# Patient Record
Sex: Female | Born: 1975
Health system: Southern US, Community
[De-identification: ages and names within clinical notes are randomized; demographics above are authoritative.]

## PROBLEM LIST (undated history)

## (undated) DIAGNOSIS — G510 Bell's palsy: Secondary | ICD-10-CM

## (undated) DIAGNOSIS — D219 Benign neoplasm of connective and other soft tissue, unspecified: Secondary | ICD-10-CM

## (undated) DIAGNOSIS — I4891 Unspecified atrial fibrillation: Secondary | ICD-10-CM

## (undated) HISTORY — DX: Benign neoplasm of connective and other soft tissue, unspecified: D21.9

## (undated) HISTORY — PX: ABDOMINAL HYSTERECTOMY: SHX81

## (undated) HISTORY — DX: Bell's palsy: G51.0

---

## 1998-10-07 ENCOUNTER — Other Ambulatory Visit: Admission: RE | Admit: 1998-10-07 | Discharge: 1998-10-07 | Payer: Self-pay | Admitting: Obstetrics and Gynecology

## 1999-05-06 ENCOUNTER — Inpatient Hospital Stay (HOSPITAL_COMMUNITY): Admission: AD | Admit: 1999-05-06 | Discharge: 1999-05-08 | Payer: Self-pay | Admitting: Obstetrics and Gynecology

## 1999-06-25 ENCOUNTER — Other Ambulatory Visit: Admission: RE | Admit: 1999-06-25 | Discharge: 1999-06-25 | Payer: Self-pay | Admitting: Obstetrics and Gynecology

## 2000-06-28 ENCOUNTER — Other Ambulatory Visit: Admission: RE | Admit: 2000-06-28 | Discharge: 2000-06-28 | Payer: Self-pay | Admitting: Obstetrics and Gynecology

## 2000-12-22 ENCOUNTER — Emergency Department (HOSPITAL_COMMUNITY): Admission: EM | Admit: 2000-12-22 | Discharge: 2000-12-22 | Payer: Self-pay | Admitting: Emergency Medicine

## 2000-12-22 ENCOUNTER — Encounter: Payer: Self-pay | Admitting: Emergency Medicine

## 2001-06-29 ENCOUNTER — Other Ambulatory Visit: Admission: RE | Admit: 2001-06-29 | Discharge: 2001-06-29 | Payer: Self-pay | Admitting: Obstetrics and Gynecology

## 2001-07-06 ENCOUNTER — Encounter: Admission: RE | Admit: 2001-07-06 | Discharge: 2001-10-04 | Payer: Self-pay | Admitting: Obstetrics and Gynecology

## 2002-07-19 ENCOUNTER — Other Ambulatory Visit: Admission: RE | Admit: 2002-07-19 | Discharge: 2002-07-19 | Payer: Self-pay | Admitting: Obstetrics and Gynecology

## 2003-07-23 ENCOUNTER — Other Ambulatory Visit: Admission: RE | Admit: 2003-07-23 | Discharge: 2003-07-23 | Payer: Self-pay | Admitting: Obstetrics and Gynecology

## 2004-11-11 ENCOUNTER — Other Ambulatory Visit: Admission: RE | Admit: 2004-11-11 | Discharge: 2004-11-11 | Payer: Self-pay | Admitting: Obstetrics and Gynecology

## 2005-09-15 ENCOUNTER — Encounter (INDEPENDENT_AMBULATORY_CARE_PROVIDER_SITE_OTHER): Payer: Self-pay | Admitting: *Deleted

## 2005-09-16 ENCOUNTER — Inpatient Hospital Stay (HOSPITAL_COMMUNITY): Admission: RE | Admit: 2005-09-16 | Discharge: 2005-09-17 | Payer: Self-pay | Admitting: Obstetrics and Gynecology

## 2005-09-17 ENCOUNTER — Ambulatory Visit: Payer: Self-pay | Admitting: Internal Medicine

## 2005-11-06 ENCOUNTER — Ambulatory Visit (HOSPITAL_COMMUNITY): Admission: RE | Admit: 2005-11-06 | Discharge: 2005-11-06 | Payer: Self-pay | Admitting: Obstetrics and Gynecology

## 2005-12-22 ENCOUNTER — Other Ambulatory Visit: Admission: RE | Admit: 2005-12-22 | Discharge: 2005-12-22 | Payer: Self-pay | Admitting: Obstetrics and Gynecology

## 2007-01-11 ENCOUNTER — Other Ambulatory Visit: Admission: RE | Admit: 2007-01-11 | Discharge: 2007-01-11 | Payer: Self-pay | Admitting: Obstetrics and Gynecology

## 2007-01-14 ENCOUNTER — Ambulatory Visit: Payer: Self-pay | Admitting: Internal Medicine

## 2007-01-14 LAB — CONVERTED CEMR LAB
ALT: 10 units/L (ref 0–35)
AST: 13 units/L (ref 0–37)
Albumin: 3.9 g/dL (ref 3.5–5.2)
Alkaline Phosphatase: 56 units/L (ref 39–117)
Anti Nuclear Antibody(ANA): NEGATIVE
BUN: 12 mg/dL (ref 6–23)
Basophils Absolute: 0.1 10*3/uL (ref 0.0–0.1)
Basophils Relative: 0.6 % (ref 0.0–1.0)
CO2: 31 meq/L (ref 19–32)
Calcium: 9.2 mg/dL (ref 8.4–10.5)
Chloride: 105 meq/L (ref 96–112)
Creatinine, Ser: 0.8 mg/dL (ref 0.4–1.2)
Eosinophils Absolute: 0.5 10*3/uL (ref 0.0–0.6)
Eosinophils Relative: 5.1 % — ABNORMAL HIGH (ref 0.0–5.0)
GFR calc Af Amer: 108 mL/min
GFR calc non Af Amer: 90 mL/min
Glucose, Bld: 90 mg/dL (ref 70–99)
HCT: 38.4 % (ref 36.0–46.0)
Hemoglobin: 13.1 g/dL (ref 12.0–15.0)
IgA: 193 mg/dL (ref 68–378)
IgE (Immunoglobulin E), Serum: 44 intl units/mL (ref 0.0–180.0)
IgG (Immunoglobin G), Serum: 825 mg/dL (ref 694–1618)
Lymphocytes Relative: 35.2 % (ref 12.0–46.0)
MCHC: 34.3 g/dL (ref 30.0–36.0)
MCV: 90.7 fL (ref 78.0–100.0)
Monocytes Absolute: 0.9 10*3/uL — ABNORMAL HIGH (ref 0.2–0.7)
Monocytes Relative: 9.3 % (ref 3.0–11.0)
Neutro Abs: 4.7 10*3/uL (ref 1.4–7.7)
Neutrophils Relative %: 49.8 % (ref 43.0–77.0)
Platelets: 328 10*3/uL (ref 150–400)
Potassium: 4 meq/L (ref 3.5–5.1)
RBC: 4.23 M/uL (ref 3.87–5.11)
RDW: 12.4 % (ref 11.5–14.6)
Sed Rate: 7 mm/hr (ref 0–25)
Sodium: 141 meq/L (ref 135–145)
Total Bilirubin: 0.8 mg/dL (ref 0.3–1.2)
Total Protein: 6.4 g/dL (ref 6.0–8.3)
WBC: 9.5 10*3/uL (ref 4.5–10.5)

## 2007-01-28 ENCOUNTER — Ambulatory Visit: Payer: Self-pay | Admitting: Internal Medicine

## 2007-03-07 ENCOUNTER — Ambulatory Visit: Payer: Self-pay | Admitting: Internal Medicine

## 2007-04-11 ENCOUNTER — Ambulatory Visit: Payer: Self-pay | Admitting: Internal Medicine

## 2008-06-20 ENCOUNTER — Encounter: Payer: Self-pay | Admitting: Gynecology

## 2008-06-20 ENCOUNTER — Ambulatory Visit: Payer: Self-pay | Admitting: Gynecology

## 2008-06-20 ENCOUNTER — Other Ambulatory Visit: Admission: RE | Admit: 2008-06-20 | Discharge: 2008-06-20 | Payer: Self-pay | Admitting: Gynecology

## 2009-06-04 ENCOUNTER — Ambulatory Visit: Payer: Self-pay | Admitting: Gynecology

## 2009-06-23 ENCOUNTER — Emergency Department (HOSPITAL_COMMUNITY): Admission: EM | Admit: 2009-06-23 | Discharge: 2009-06-23 | Payer: Self-pay | Admitting: Emergency Medicine

## 2009-06-25 ENCOUNTER — Ambulatory Visit: Payer: Self-pay | Admitting: Gynecology

## 2009-06-25 ENCOUNTER — Other Ambulatory Visit: Admission: RE | Admit: 2009-06-25 | Discharge: 2009-06-25 | Payer: Self-pay | Admitting: Gynecology

## 2010-07-30 ENCOUNTER — Other Ambulatory Visit
Admission: RE | Admit: 2010-07-30 | Discharge: 2010-07-30 | Payer: Self-pay | Source: Home / Self Care | Admitting: Gynecology

## 2010-07-30 ENCOUNTER — Ambulatory Visit
Admission: RE | Admit: 2010-07-30 | Discharge: 2010-07-30 | Payer: Self-pay | Source: Home / Self Care | Attending: Gynecology | Admitting: Gynecology

## 2010-12-02 NOTE — Assessment & Plan Note (Signed)
[facility]                             PULMONARY OFFICE NOTE   NAME:Anna Evans, Anna Evans                      MRN:          [id number]  DATE:03/07/2007                            DOB:          February 15, 1976    PROBLEM:  Urticaria.   HISTORY:  Still shifting urticaria arms and legs.  Doxepin made her feel  funny so she quit after two weeks recognizing no benefit using it as an  antihistamine at 10 mg daily.  Overall she thinks she is somewhat  better, and that she does not seem to develop lesions every day.  Prednisone did work while she was on it.   MEDICATIONS:  No regular meds now.  Occasional Benadryl, Claritin.   ALLERGIES:  No medication allergy known.   LABORATORY DATA:  Had shown 0.5% eosinophils which is unremarkable with  a sed rate 7.  Normal liver enzymes.  IgA 193, IgG 825.  WBC 9500,  hemoglobin 13.1, ANA negative.  IgE 44 which is not very high.   OBJECTIVE/PHYSICAL EXAMINATION:  VITAL SIGNS:  Weight 181 pounds, BP  102/66, pulse 88, room air saturation 99%.  SKIN:  A little bit of excoriation, no visible urticarial lesions now,  nothing specific, no adenopathy or drainage.  CHEST:  Clear.  HEART:  Heart sounds normal.  I cannot feel liver or spleen.   IMPRESSION:  Subacute urticaria.   PLAN:  Try prednisone 10 mg daily for one month with steroid talk done.  She understands that this very routinely remains an idiopathic self-  limited process even though it may stick with her for years.  We again  reviewed some common triggers and circumstances to watch for.  Schedule  return in one month.     Clinton D. Maple Hudson, MD, Tonny Bollman, FACP  Electronically Signed    CDY/MedQ  DD: 03/21/2007  DT: 03/21/2007  Job #: [id number]   cc:   Donia Guiles, M.D.  Daniel B. Yetta Barre, M.D.

## 2010-12-02 NOTE — Assessment & Plan Note (Signed)
[facility]                             PULMONARY OFFICE NOTE   NAME:Scollard, ZAMERIA VOGL                      MRN:          [id number]  DATE:04/11/2007                            DOB:          [birth year]    PROBLEM LIST:  Urticaria.   HISTORY OF PRESENT ILLNESS:  No hives in 2 to 3 weeks. They were fading  while she was still on prednisone but she has been off of that. Taking  no medicines at all currently and she feels quite well.   OBJECTIVE:  VITAL SIGNS:  Weight 178 pounds. Blood pressure 106/72,  pulse 79, room air saturation 99%.  SKIN:  Looks clear.  HEENT:  No adenopathy.  GENERAL:  She seems comfortable.   IMPRESSION:  Urticaria, resolved. No specific etiology or therapeutic  response identified.   PLAN:  Return p.r.n.     Clinton D. Maple Hudson, MD, Tonny Bollman, FACP  Electronically Signed    CDY/MedQ  DD: 04/16/2007  DT: 04/16/2007  Job #: [id number]   cc:   Donia Guiles, M.D.

## 2010-12-02 NOTE — Assessment & Plan Note (Signed)
[facility]                             PULMONARY OFFICE NOTE   NAME:Alverson, Anna Evans                      MRN:          [id number]  DATE:01/28/2007                            DOB:          [birth year]    PROBLEM:  Urticaria.   HISTORY:  She tried switching away from the [employer] softener  product with no benefit obtained. She feels about the same, itching with  little visible rash. We reviewed available labs, significant for an ANA  negative, an IGE in normal range at 44 (0 to 180), white blood count  9,500 with hemoglobin 13.1, and 5.1% eosinophils is really the only  slightly note worthy finding. Her chemistry profile was completely  normal including liver enzymes. IGA was 123, IGG 825.   OBJECTIVE:  Weight 178 pounds, blood pressure 118/76, pulse 60, room air  saturation 98%.  I did a dry scratch on her volar forearm with normal flare but no wheal,  found no adenopathy.  LUNGS: Clear.  I could not feel enlargement of liver or spleen.   IMPRESSION:  Urticaria with pruritus. Eosinophil count of 5.1% is  suggestive at best.   PLAN:  Try doxepin as an antihistamine, 10 mg, 1 or 2 at bedtime p.r.n.  We discussed this medication. We will consider low dose prednisone.  Schedule return 1 month, earlier p.r.n. She is encouraged to follow up  with her other doctors as well.     Clinton D. Maple Hudson, MD, Tonny Bollman, FACP  Electronically Signed    CDY/MedQ  DD: 02/02/2007  DT: 02/03/2007  Job #: [id number]   cc:   Donia Guiles, M.D.  Daniel B. Yetta Barre, M.D.

## 2010-12-02 NOTE — Assessment & Plan Note (Signed)
[facility]                             PULMONARY OFFICE NOTE   NAME:Dohrmann, BERNARD DONAHOO                      MRN:          [id number]  DATE:01/14/2007                            DOB:          [birth year]    NEW PATIENT   PROBLEMS:  A 35 year old woman previously followed by me at [facility]  Chest Disease and Allergy.  Now self-referred.  Concerned about a  pruritic rash.   HISTORY:  When I had seen her over 10 years ago, it was for allergic  rhinitis, particularly to cats.  She comes now with a new problem of  itching and rash that began only about 3 weeks ago and is new for her.  She saw Dr. Arvilla Market and Dr. Karlyn Agee.  A prednisone course cleared it  up a few days after she finished the prednisone, though she says she can  feel it coming back again, mainly on her arms and legs as before.  It  bothers her most starting in the evening, and then at night after she  lies down.  She quit going to a tanning bed and has been staying  indoors, for what she thinks might be environmental factors.  There has  been no recent viral illness.  She has no history of pertinent systemic  disease, liver or thyroid problems.  She has not noticed any association  with either her home or work environment, cosmetics, foods, or other  exposure.  She has been using Bounce dryer sheets, and I told her people  do occasionally get sensitized to bacterial enzymes in those, most  commonly with urticaria, in my experience.   MEDICATIONS:  Recent prednisone, now finished.  P.r.n. Benadryl.  No  routine meds, including hormones, herbal, or alternative preparations.   No medication allergy.   REVIEW OF SYSTEMS:  She is not pregnant.  She does not notice  significant nasal congestion, itching of eyes or nose, cough, or wheeze,  joint pain, GI or GU changes.  She still has her ovaries and does cycle,  but has not recognized any relationship to this.   PAST HISTORY:  Remote  history of allergic rhinitis, especially sensitive  to cats.  Mild intermittent asthma.  Partial hysterectomy in 2007.  No  intolerance to latex, contrast dye, or aspirin.   SOCIAL HISTORY:  Never smoked.  One alcoholic beverage per week.  She is  married.  Living with a dog in the house (for 4 years now).  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   FAMILY HISTORY:  Mother with allergies.  Grandparents with cancer.   OBJECTIVE:  Weight 183 pounds, BP 96/62, pulse 63, room air saturation  98%.  Healthy, friendly, comfortable-appearing young woman.  There is a very faint, spotty hypopigmentation evident through tan on  the arms and legs without excoriation, although she keeps rubbing her  arms.  I do not see erythema, raised lesions, or adenopathy.  Nose, throat, and chest are clear.  HEART:  Sounds are regular without murmur.  There  is no enlargement of liver or spleen.   IMPRESSION:  Urticaria, acute.   PLAN:  1. She is going to stop using Bounce and other enzyme-based laundry      products, and she is going to wash out her dryer drum.  2. Blood for CBC with diff, chemistry panel, sedimentation rate, IgE      ANA.  3. She has samples of Xyzal she has not tried yet, so I told her to go      ahead, comparing with Benadryl.  4. Schedule return in 3 weeks, earlier p.r.n.  I do not know if she      has followup with her other doctors, but I will encourage that when      I see her next.     Clinton D. Maple Hudson, MD, Tonny Bollman, FACP  Electronically Signed    CDY/MedQ  DD: 01/15/2007  DT: 01/15/2007  Job #: [id number]   cc:   Donia Guiles, M.D.  Daniel B. Yetta Barre, M.D.

## 2010-12-05 NOTE — H&P (Signed)
NAMEPRESLEA, RHODUS               ACCOUNT NO.:  [id number]   MEDICAL RECORD NO.:  [id number]          PATIENT TYPE:  AMB   LOCATION:  SDC                           FACILITY:  [facility]   PHYSICIAN:  James A. Ashley Royalty, M.D.DATE OF BIRTH:  1976/05/16   DATE OF ADMISSION:  09/15/2005  DATE OF DISCHARGE:                                HISTORY & PHYSICAL   HISTORY OF PRESENT ILLNESS:  This is a 35 year old gravida 1, para 1 with a  chief complaint of abnormal uterine bleeding.  The patient had a  sonohystogram on May 07, 2005 for the above complaint which revealed no  intracavitary bleeding.  She was treated with [facility] beginning October  2006.  Unfortunately, it did not result in any control of her abnormal  uterine bleeding.  She ultimately decided she preferred definitive therapy  in the form of hysterectomy.   MEDICATIONS:  None.   PAST MEDICAL HISTORY:   MEDICAL:  1.  Asthma.  2.  Irritable bowel syndrome.   SURGICAL:  1.  Breast biopsy.  2.  Therapeutic abortion.   ALLERGIES:  None.   FAMILY HISTORY:  Noncontributory.   SOCIAL HISTORY:  Noncontributory.   REVIEW OF SYSTEMS:  Noncontributory.   PHYSICAL EXAMINATION:  GENERAL:  A well-developed, well-nourished pleasant  white female in no acute distress.  VITAL SIGNS:  Afebrile.  Vital signs stable.  SKIN:  Warm and dry without lesions.  CHEST:  Lungs are clear.  CARDIAC:  Regular rate and rhythm without murmurs, gallops, or rubs.  ABDOMEN:  Soft and nontender without masses or organomegaly.  MUSCULOSKELETAL:  No CVA tenderness.  PELVIC EXAMINATION:  (See August 25, 2005 note.)  Genitalia within normal  limits.  Vagina and cervix were without gross lesions.  Bimanual examination  reveals the uterus to be approximately 8 x 4 x 4 cm.  No adnexal masses are  palpable.   IMPRESSION:  Abnormal uterine bleeding, as well as coital bleeding,  refractory to medical management.   PLAN:  Total vaginal hysterectomy.   Risks, benefits, complications, and  alternatives were fully discussed with the patient.  The possible need for a  laparoscopically-assisted vaginal hysterectomy or a total abdominal  hysterectomy were discussed and accepted.  The possibility of unilateral  salpingo-oophorectomy or bilateral salpingo-oophorectomy were discussed and  accepted.  In the latter event, the patient understands she would become a  candidate for hormone replacement surgery, and agrees to same.  Questions  invited and answered.      James A. Ashley Royalty, M.D.  Electronically Signed     JAM/MEDQ  D:  09/14/2005  T:  09/14/2005  Job:  [id number]

## 2010-12-05 NOTE — Op Note (Signed)
Anna Evans, Anna Evans               ACCOUNT NO.:  [id number]   MEDICAL RECORD NO.:  [id number]          PATIENT TYPE:  OBV   LOCATION:  9311                          FACILITY:  [facility]   PHYSICIAN:  James A. Ashley Royalty, M.D.DATE OF BIRTH:  03/12/1976   DATE OF PROCEDURE:  09/15/2005  DATE OF DISCHARGE:                                 OPERATIVE REPORT   PREOPERATIVE DIAGNOSIS:  Abnormal uterine bleeding that is refractory to  medical management.   POSTOPERATIVE DIAGNOSIS:  Abnormal uterine bleeding that is refractory to  medical management, pathology pending.   PROCEDURE:  Total vaginal hysterectomy.   SURGEON:  Rudy Jew. Ashley Royalty, M.D.   ASSISTANT:  Gerald Leitz, M.D.   ANESTHESIA:  General.   ESTIMATED BLOOD LOSS:  100 mL.   COMPLICATIONS:  None.   PACKS AND DRAINS:  None.   COUNTS:  Sponge, needle and instrument counts were reported as correct x2.   DESCRIPTION OF PROCEDURE:  The patient was taken to the operating room and  placed in the dorsal supine position.  After general anesthesia was  administered, she was placed in the lithotomy position and prepped and  draped in the usual manner for vaginal surgery.  Preoperative antibiotics  were given.  Foley catheter was placed. The posterior weighted retractor was  placed per vagina.  The anterior lip of cervix was grasped with a Christella Hartigan  tenaculum.  Approximately 20 cc of 4% Xylocaine with epinephrine were  instilled to aid in hemostasis.  Next the cervix was circumscribed with a  knife.  Posterior colpotomy incision was made.  The Steiner-Adard retractor  was placed.  The uterosacral ligaments were clamped, cut and secured with a  #1 chromic catgut.  The pedicles were held.  The cardinal ligaments were  clamped, cut and secured bilaterally with #1 chromic catgut.  The anterior  cul-de-sac was entered uneventfully.  The uterine vessels were clamped, cut  and doubly secured with #1 chromic catgut.  Additional pedicles were taken  on  either side of the uterus from peritoneum to peritoneum closer to the  level of the adnexa.  Pedicles were clamped, cut, and secured with #1  chromic catgut.  At this point, the corpus was delivered posteriorly with a  single-tooth tenaculum.  The ovarian anastomoses, round ligaments and  fallopian tubes were clamped, cut and doubly secured with #1 chromic catgut.  The pedicles were noted be hemostatic.  At this point, the shorter posterior  weighted retractor was placed. The posterior vaginal cuff was closed with a  2-0 Vicryl running locking suture.  Irrigation was accomplished.  The  adnexal pedicles were once again inspected and found to be normal and  hemostatic.  The remainder of the field was hemostatic as well.  A 3-0  Vicryl hemi-pursestring suture was placed approximately 1 cm cephalad from  the vaginal cuff on the posterior aspect of the field to assist in  obliterating the cul-de-sac and avoid future enterocele formation.  Finally  the vagina was closed with interrupted figure-of-eight  sutures of 0 chromic catgut.  The field was noted to be hemostatic.  The  vaginal cuff was closed and the procedure terminated.  At the conclusion of  procedure the urine was clear and copious.   The patient tolerated the procedure extremely well and was returned to the  recovery room in good condition.      James A. Ashley Royalty, M.D.  Electronically Signed     JAM/MEDQ  D:  09/15/2005  T:  09/15/2005  Job:  [id number]

## 2010-12-05 NOTE — Discharge Summary (Signed)
Anna Evans, Anna Evans               ACCOUNT NO.:  [id number]   MEDICAL RECORD NO.:  [id number]          PATIENT TYPE:  INP   LOCATION:  9311                          FACILITY:  [facility]   PHYSICIAN:  James A. Ashley Royalty, M.D.DATE OF BIRTH:  08-17-1975   DATE OF ADMISSION:  09/15/2005  DATE OF DISCHARGE:  09/17/2005                                 DISCHARGE SUMMARY   DISCHARGE DIAGNOSES:  1.  Endometriosis.  2.  Abnormal uterine bleeding - refractory to medical management.  3.  Probable postoperative hematoma - stable.  4.  Anemia - probably secondary to #3 - improved.  5.  Asthma.  6.  Irritable bowel syndrome.   OPERATION:  Total vaginal hysterectomy.   CONSULTATION:  Cliffton Asters, M.D., infectious disease.   DISCHARGE MEDICATIONS:  1.  Percocet 5/325 one p.o. q.3-4h. p.r.n. pain.  2.  Motrin 600 mg q.i.d. p.r.n. pain.  3.  Chromagen one p.o. daily.   HISTORY AND PHYSICAL:  This is a 35 year old gravida 1, para 1, with the  aforementioned issues, admitted for definitive therapy in the form of  hysterectomy.  For the remainder of the history and physical, please see  chart.   HOSPITAL COURSE:  Patient was admitted to [facility].  Admission laboratory studies performed.  On September 15, 2005, she was taken  to the operating room and underwent total vaginal hysterectomy per Fayrene Fearing A.  Ashley Royalty, M.D.  The procedure was uncomplicated.  Postoperatively, the  patient dropped her hemoglobin to a level of 5.8 on postoperative day #1.  Serial hemoglobin were obtained and were noted to be stable in the vicinity  of 5.9.  Though the patient was clinically stable, it was felt in her  benefit to receive a modest blood transfusion.  Risks and benefits of same  discussed with patient.  Three units of packed red blood cells were given.  Shortly after the transfusion as initiated, the patient had a temperature  elevation to the 101 degrees F. range.  Subsequently infectious  disease was  consulted and Dr. Cliffton Asters saw the patient.  She was found to have  atelectasis.  It was also suspected that she probably had a pelvic hematoma  which was stable.  On September 17, 2005,  the patient was doing well clinically with hemoglobin of 9.1.  Cultures were  pending.  Patient was felt to be stable for discharge and was discharged  home the afternoon of September 17, 2005, afebrile and in satisfactory  condition.   DISPOSITION:  Patient is to return to [facility] in  approximately one day for follow-up.      James A. Ashley Royalty, M.D.  Electronically Signed     JAM/MEDQ  D:  10/22/2005  T:  10/23/2005  Job:  [id number]

## 2013-04-25 ENCOUNTER — Encounter: Payer: Self-pay | Admitting: Gynecology

## 2013-04-25 ENCOUNTER — Ambulatory Visit (INDEPENDENT_AMBULATORY_CARE_PROVIDER_SITE_OTHER): Payer: BC Managed Care – PPO | Admitting: Gynecology

## 2013-04-25 VITALS — BP 120/82 | Ht 67.0 in | Wt 207.0 lb

## 2013-04-25 DIAGNOSIS — Z01419 Encounter for gynecological examination (general) (routine) without abnormal findings: Secondary | ICD-10-CM

## 2013-04-25 DIAGNOSIS — Z23 Encounter for immunization: Secondary | ICD-10-CM

## 2013-04-25 DIAGNOSIS — R635 Abnormal weight gain: Secondary | ICD-10-CM

## 2013-04-25 LAB — CBC WITH DIFFERENTIAL/PLATELET
Basophils Absolute: 0 10*3/uL (ref 0.0–0.1)
Basophils Relative: 0 % (ref 0–1)
Eosinophils Absolute: 0.4 10*3/uL (ref 0.0–0.7)
Eosinophils Relative: 5 % (ref 0–5)
Lymphs Abs: 3.4 10*3/uL (ref 0.7–4.0)
MCH: 30.4 pg (ref 26.0–34.0)
MCHC: 33.8 g/dL (ref 30.0–36.0)
Neutrophils Relative %: 52 % (ref 43–77)
Platelets: 320 10*3/uL (ref 150–400)
RBC: 4.25 MIL/uL (ref 3.87–5.11)
RDW: 13.8 % (ref 11.5–15.5)

## 2013-04-25 NOTE — Progress Notes (Signed)
Anna Evans Dec 09, 1975 [id number]   History:    37 y.o.  for annual gyn exam whose only made complaint has been weight gain. Patient had been seen since 2012. Patient with prior history of transvaginal hysterectomy. Patient with no history of abnormal Pap smears in the past. Patient would like to have the flu vaccine today.  Past medical history,surgical history, family history and social history were all reviewed and documented in the EPIC chart.  Gynecologic History No LMP recorded. Patient has had a hysterectomy. Contraception: status post hysterectomy Last Pap: 2012. Results were: normal Last mammogram: no prior study. Results were: no prior study  Obstetric History OB History  Gravida Para Term Preterm AB SAB TAB Ectopic Multiple Living  1 1        1     # Outcome Date GA Lbr Len/2nd Weight Sex Delivery Anes PTL Lv  1 PAR                ROS: A ROS was performed and pertinent positives and negatives are included in the history.  GENERAL: No fevers or chills. HEENT: No change in vision, no earache, sore throat or sinus congestion. NECK: No pain or stiffness. CARDIOVASCULAR: No chest pain or pressure. No palpitations. PULMONARY: No shortness of breath, cough or wheeze. GASTROINTESTINAL: No abdominal pain, nausea, vomiting or diarrhea, melena or bright red blood per rectum. GENITOURINARY: No urinary frequency, urgency, hesitancy or dysuria. MUSCULOSKELETAL: No joint or muscle pain, no back pain, no recent trauma. DERMATOLOGIC: No rash, no itching, no lesions. ENDOCRINE: No polyuria, polydipsia, no heat or cold intolerance. No recent change in weight. HEMATOLOGICAL: No anemia or easy bruising or bleeding. NEUROLOGIC: No headache, seizures, numbness, tingling or weakness. PSYCHIATRIC: No depression, no loss of interest in normal activity or change in sleep pattern.     Exam: chaperone present  BP 120/82  Ht 5\' 7"  (1.702 m)  Wt 207 lb (93.895 kg)  BMI 32.41 kg/m2  Body mass index  is 32.41 kg/(m^2).  General appearance : Well developed well nourished female. No acute distress HEENT: Neck supple, trachea midline, no carotid bruits, no thyroidmegaly Lungs: Clear to auscultation, no rhonchi or wheezes, or rib retractions  Heart: Regular rate and rhythm, no murmurs or gallops Breast:Examined in sitting and supine position were symmetrical in appearance, no palpable masses or tenderness,  no skin retraction, no nipple inversion, no nipple discharge, no skin discoloration, no axillary or supraclavicular lymphadenopathy Abdomen: no palpable masses or tenderness, no rebound or guarding Extremities: no edema or skin discoloration or tenderness  Pelvic:  Bartholin, Urethra, Skene Glands: Within normal limits             Vagina: No gross lesions or discharge  Cervix:absent  Uterus absent Adnexa  Without masses or tenderness  Anus and perineum  normal   Rectovaginal  normal sphincter tone without palpated masses or tenderness             Hemoccult not indicated     Assessment/Plan:  37 y.o. female for annual exam was given literature and information on weight reduction diet and exercise. The following labs were ordered: CBC, screen cholesterol, TSH, comprehensive metabolic panel, and urinalysis. Pap smear not done in accordance to the new guidelines. Patient arrived to do her monthly breast exams. Flu vaccine administered today.  Note: This dictation was prepared with  Dragon/digital dictation along withSmart phrase technology. Any transcriptional errors that result from this process are unintentional.   Ok Edwards MD, 2:54 PM  04/25/2013    

## 2013-04-25 NOTE — Patient Instructions (Addendum)
Tetanus, Diphtheria, Pertussis (Tdap) Vaccine What You Need to Know WHY GET VACCINATED? Tetanus, diphtheria and pertussis can be very serious diseases, even for adolescents and adults. Tdap vaccine can protect us from these diseases. TETANUS (Lockjaw) causes painful muscle tightening and stiffness, usually all over the body.  It can lead to tightening of muscles in the head and neck so you can't open your mouth, swallow, or sometimes even breathe. Tetanus kills about 1 out of 5 people who are infected. DIPHTHERIA can cause a thick coating to form in the back of the throat.  It can lead to breathing problems, paralysis, heart failure, and death. PERTUSSIS (Whooping Cough) causes severe coughing spells, which can cause difficulty breathing, vomiting and disturbed sleep.  It can also lead to weight loss, incontinence, and rib fractures. Up to 2 in 100 adolescents and 5 in 100 adults with pertussis are hospitalized or have complications, which could include pneumonia and death. These diseases are caused by bacteria. Diphtheria and pertussis are spread from person to person through coughing or sneezing. Tetanus enters the body through cuts, scratches, or wounds. Before vaccines, the [place] saw as many as 200,000 cases a year of diphtheria and pertussis, and hundreds of cases of tetanus. Since vaccination began, tetanus and diphtheria have dropped by about 99% and pertussis by about 80%. TDAP VACCINE Tdap vaccine can protect adolescents and adults from tetanus, diphtheria, and pertussis. One dose of Tdap is routinely given at age 11 or 12. People who did not get Tdap at that age should get it as soon as possible. Tdap is especially important for health care professionals and anyone having close contact with a baby younger than 12 months. Pregnant women should get a dose of Tdap during every pregnancy, to protect the newborn from pertussis. Infants are most at risk for severe, life-threatening  complications from pertussis. A similar vaccine, called Td, protects from tetanus and diphtheria, but not pertussis. A Td booster should be given every 10 years. Tdap may be given as one of these boosters if you have not already gotten a dose. Tdap may also be given after a severe cut or burn to prevent tetanus infection. Your doctor can give you more information. Tdap may safely be given at the same time as other vaccines. SOME PEOPLE SHOULD NOT GET THIS VACCINE  If you ever had a life-threatening allergic reaction after a dose of any tetanus, diphtheria, or pertussis containing vaccine, OR if you have a severe allergy to any part of this vaccine, you should not get Tdap. Tell your doctor if you have any severe allergies.  If you had a coma, or long or multiple seizures within 7 days after a childhood dose of DTP or DTaP, you should not get Tdap, unless a cause other than the vaccine was found. You can still get Td.  Talk to your doctor if you:  have epilepsy or another nervous system problem,  had severe pain or swelling after any vaccine containing diphtheria, tetanus or pertussis,  ever had Guillain-Barr Syndrome (GBS),  aren't feeling well on the day the shot is scheduled. RISKS OF A VACCINE REACTION With any medicine, including vaccines, there is a chance of side effects. These are usually mild and go away on their own, but serious reactions are also possible. Brief fainting spells can follow a vaccination, leading to injuries from falling. Sitting or lying down for about 15 minutes can help prevent these. Tell your doctor if you feel dizzy or light-headed, or   have vision changes or ringing in the ears. Mild problems following Tdap (Did not interfere with activities)  Pain where the shot was given (about 3 in 4 adolescents or 2 in 3 adults)  Redness or swelling where the shot was given (about 1 person in 5)  Mild fever of at least 100.44F (up to about 1 in 25 adolescents or 1 in  100 adults)  Headache (about 3 or 4 people in 10)  Tiredness (about 1 person in 3 or 4)  Nausea, vomiting, diarrhea, stomach ache (up to 1 in 4 adolescents or 1 in 10 adults)  Chills, body aches, sore joints, rash, swollen glands (uncommon) Moderate problems following Tdap (Interfered with activities, but did not require medical attention)  Pain where the shot was given (about 1 in 5 adolescents or 1 in 100 adults)  Redness or swelling where the shot was given (up to about 1 in 16 adolescents or 1 in 25 adults)  Fever over 102F (about 1 in 100 adolescents or 1 in 250 adults)  Headache (about 3 in 20 adolescents or 1 in 10 adults)  Nausea, vomiting, diarrhea, stomach ache (up to 1 or 3 people in 100)  Swelling of the entire arm where the shot was given (up to about 3 in 100). Severe problems following Tdap (Unable to perform usual activities, required medical attention)  Swelling, severe pain, bleeding and redness in the arm where the shot was given (rare). A severe allergic reaction could occur after any vaccine (estimated less than 1 in a million doses). WHAT IF THERE IS A SERIOUS REACTION? What should I look for?  Look for anything that concerns you, such as signs of a severe allergic reaction, very high fever, or behavior changes. Signs of a severe allergic reaction can include hives, swelling of the face and throat, difficulty breathing, a fast heartbeat, dizziness, and weakness. These would start a few minutes to a few hours after the vaccination. What should I do?  If you think it is a severe allergic reaction or other emergency that can't wait, call 9-1-1 or get the person to the nearest hospital. Otherwise, call your doctor.  Afterward, the reaction should be reported to the "Vaccine Adverse Event Reporting System" (VAERS). Your doctor might file this report, or you can do it yourself through the VAERS web site at www.vaers.LAgents.no, or by calling 1-647-773-0563. VAERS is  only for reporting reactions. They do not give medical advice.  THE NATIONAL VACCINE INJURY COMPENSATION PROGRAM The National Vaccine Injury Compensation Program (VICP) is a federal program that was created to compensate people who may have been injured by certain vaccines. Persons who believe they may have been injured by a vaccine can learn about the program and about filing a claim by calling 1-970-335-7593 or visiting the VICP website at SpiritualWord.at. HOW CAN I LEARN MORE?  Ask your doctor.  Call your local or state health department.  Contact the Centers for Disease Control and Prevention (CDC):  Call 424-224-7334 or visit CDC's website at PicCapture.uy. CDC Tdap Vaccine VIS (11/26/11) Document Released: 01/05/2012 Document Revised: 03/30/2012 Document Reviewed: 01/05/2012 [employer] Patient Information 2014 [place], [place]. Influenza Vaccine (Flu Vaccine, Inactivated) 2013 2014 What You Need to Know WHY GET VACCINATED?  Influenza ("flu") is a contagious disease that spreads around the [place] every winter, usually between October and May.  Flu is caused by the influenza virus, and can be spread by coughing, sneezing, and close contact.  Anyone can get flu, but the risk  of getting flu is highest among children. Symptoms come on suddenly and may last several days. They can include:  Fever or chills.  Sore throat.  Muscle aches.  Fatigue.  Cough.  Headache.  Runny or stuffy nose. Flu can make some people much sicker than others. These people include young children, people 82 and older, pregnant women, and people with certain health conditions such as heart, lung or kidney disease, or a weakened immune system. Flu vaccine is especially important for these people, and anyone in close contact with them. Flu can also lead to pneumonia, and make existing medical conditions worse. It can cause diarrhea and seizures in children. Each year thousands  of people in the [place] die from flu, and many more are hospitalized. Flu vaccine is the best protection we have from flu and its complications. Flu vaccine also helps prevent spreading flu from person to person. INACTIVATED FLU VACCINE There are 2 types of influenza vaccine:  You are getting an inactivated flu vaccine, which does not contain any live influenza virus. It is given by injection with a needle, and often called the "flu shot."  A different live, attenuated (weakened) influenza vaccine is sprayed into the nostrils. This vaccine is described in a separate Vaccine Information Statement. Flu vaccine is recommended every year. Children 6 months through 51 years of age should get 2 doses the first year they get vaccinated. Flu viruses are always changing. Each year's flu vaccine is made to protect from viruses that are most likely to cause disease that year. While flu vaccine cannot prevent all cases of flu, it is our best defense against the disease. Inactivated flu vaccine protects against 3 or 4 different influenza viruses. It takes about 2 weeks for protection to develop after the vaccination, and protection lasts several months to a year. Some illnesses that are not caused by influenza virus are often mistaken for flu. Flu vaccine will not prevent these illnesses. It can only prevent influenza. A "high-dose" flu vaccine is available for people 12 years of age and older. The person giving you the vaccine can tell you more about it. Some inactivated flu vaccine contains a very small amount of a mercury-based preservative called thimerosal. Studies have shown that thimerosal in vaccines is not harmful, but flu vaccines that do not contain a preservative are available. SOME PEOPLE SHOULD NOT GET THIS VACCINE Tell the person who gives you the vaccine:  If you have any severe (life-threatening) allergies. If you ever had a life-threatening allergic reaction after a dose of flu vaccine,  or have a severe allergy to any part of this vaccine, you may be advised not to get a dose. Most, but not all, types of flu vaccine contain a small amount of egg.  If you ever had Guillain Barr Syndrome (a severe paralyzing illness, also called GBS). Some people with a history of GBS should not get this vaccine. This should be discussed with your doctor.  If you are not feeling well. They might suggest waiting until you feel better. But you should come back. RISKS OF A VACCINE REACTION With a vaccine, like any medicine, there is a chance of side effects. These are usually mild and go away on their own. Serious side effects are also possible, but are very rare. Inactivated flu vaccine does not contain live flu virus, sogetting flu from this vaccine is not possible. Brief fainting spells and related symptoms (such as jerking movements) can happen after any medical procedure, including  vaccination. Sitting or lying down for about 15 minutes after a vaccination can help prevent fainting and injuries caused by falls. Tell your doctor if you feel dizzy or lightheaded, or have vision changes or ringing in the ears. Mild problems following inactivated flu vaccine:  Soreness, redness, or swelling where the shot was given.  Hoarseness; sore, red or itchy eyes; or cough.  Fever.  Aches.  Headache.  Itching.  Fatigue. If these problems occur, they usually begin soon after the shot and last 1 or 2 days. Moderate problems following inactivated flu vaccine:  Young children who get inactivated flu vaccine and pneumococcal vaccine (PCV13) at the same time may be at increased risk for seizures caused by fever. Ask your doctor for more information. Tell your doctor if a child who is getting flu vaccine has ever had a seizure. Severe problems following inactivated flu vaccine:  A severe allergic reaction could occur after any vaccine (estimated less than 1 in a million doses).  There is a small  possibility that inactivated flu vaccine could be associated with Guillan Barr Syndrome (GBS), no more than 1 or 2 cases per million people vaccinated. This is much lower than the risk of severe complications from flu, which can be prevented by flu vaccine. The safety of vaccines is always being monitored. For more information, visit: http://floyd.org/ WHAT IF THERE IS A SERIOUS REACTION? What should I look for?  Look for anything that concerns you, such as signs of a severe allergic reaction, very high fever, or behavior changes. Signs of a severe allergic reaction can include hives, swelling of the face and throat, difficulty breathing, a fast heartbeat, dizziness, and weakness. These would start a few minutes to a few hours after the vaccination. What should I do?  If you think it is a severe allergic reaction or other emergency that cannot wait, call [phone] 1  or get the person to the nearest hospital. Otherwise, call your doctor.  Afterward, the reaction should be reported to the Vaccine Adverse Event Reporting System (VAERS). Your doctor might file this report, or you can do it yourself through the VAERS website at www.vaers.LAgents.no, or by calling 1-601-757-1446. VAERS is only for reporting reactions. They do not give medical advice. THE NATIONAL VACCINE INJURY COMPENSATION PROGRAM The National Vaccine Injury Compensation Program (VICP) is a federal program that was created to compensate people who may have been injured by certain vaccines. Persons who believe they may have been injured by a vaccine can learn about the program and about filing a claim by calling 1-(575) 715-5717 or visiting the VICP website at SpiritualWord.at HOW CAN I LEARN MORE?  Ask your doctor.  Call your local or state health department.  Contact the Centers for Disease Control and Prevention (CDC):  Call 904-125-9442 (1-800-CDC-INFO) or  Visit CDC's website at BiotechRoom.com.cy CDC  Inactivated Influenza Vaccine Interim VIS (02/12/12) Document Released: 04/30/2006 Document Revised: 03/30/2012 Document Reviewed: 02/12/2012 [facility] Patient Information 2014 [place], [place].

## 2013-04-26 LAB — URINALYSIS W MICROSCOPIC + REFLEX CULTURE
Bacteria, UA: NONE SEEN
Bilirubin Urine: NEGATIVE
Casts: NONE SEEN
Crystals: NONE SEEN
Ketones, ur: NEGATIVE mg/dL
Nitrite: NEGATIVE
Specific Gravity, Urine: 1.024 (ref 1.005–1.030)
Urobilinogen, UA: 0.2 mg/dL (ref 0.0–1.0)
pH: 5.5 (ref 5.0–8.0)

## 2013-04-26 LAB — COMPREHENSIVE METABOLIC PANEL
ALT: 10 U/L (ref 0–35)
Alkaline Phosphatase: 55 U/L (ref 39–117)
Creat: 0.69 mg/dL (ref 0.50–1.10)
Sodium: 139 mEq/L (ref 135–145)
Total Bilirubin: 0.3 mg/dL (ref 0.3–1.2)
Total Protein: 6.5 g/dL (ref 6.0–8.3)

## 2014-05-21 ENCOUNTER — Encounter: Payer: Self-pay | Admitting: Gynecology

## 2014-09-20 ENCOUNTER — Ambulatory Visit (INDEPENDENT_AMBULATORY_CARE_PROVIDER_SITE_OTHER): Payer: BLUE CROSS/BLUE SHIELD | Admitting: Gynecology

## 2014-09-20 ENCOUNTER — Encounter: Payer: Self-pay | Admitting: Gynecology

## 2014-09-20 VITALS — BP 118/70 | Ht 67.0 in | Wt 217.0 lb

## 2014-09-20 DIAGNOSIS — Z01419 Encounter for gynecological examination (general) (routine) without abnormal findings: Secondary | ICD-10-CM

## 2014-09-20 DIAGNOSIS — R635 Abnormal weight gain: Secondary | ICD-10-CM

## 2014-09-20 LAB — CBC WITH DIFFERENTIAL/PLATELET
Basophils Absolute: 0.1 10*3/uL (ref 0.0–0.1)
Basophils Relative: 1 % (ref 0–1)
EOS ABS: 0.2 10*3/uL (ref 0.0–0.7)
EOS PCT: 3 % (ref 0–5)
HCT: 39.6 % (ref 36.0–46.0)
Hemoglobin: 13 g/dL (ref 12.0–15.0)
LYMPHS ABS: 2.9 10*3/uL (ref 0.7–4.0)
Lymphocytes Relative: 36 % (ref 12–46)
MCH: 29.5 pg (ref 26.0–34.0)
MCHC: 32.8 g/dL (ref 30.0–36.0)
MCV: 90 fL (ref 78.0–100.0)
MPV: 9.2 fL (ref 8.6–12.4)
Monocytes Absolute: 0.6 10*3/uL (ref 0.1–1.0)
Monocytes Relative: 7 % (ref 3–12)
Neutro Abs: 4.3 10*3/uL (ref 1.7–7.7)
Neutrophils Relative %: 53 % (ref 43–77)
PLATELETS: 363 10*3/uL (ref 150–400)
RBC: 4.4 MIL/uL (ref 3.87–5.11)
RDW: 13.7 % (ref 11.5–15.5)
WBC: 8.1 10*3/uL (ref 4.0–10.5)

## 2014-09-20 LAB — LIPID PANEL
CHOL/HDL RATIO: 4 ratio
Cholesterol: 190 mg/dL (ref 0–200)
HDL: 48 mg/dL (ref >=46)
LDL CALC: 122 mg/dL — AB (ref 0–99)
TRIGLYCERIDES: 102 mg/dL (ref <150)
VLDL: 20 mg/dL (ref 0–40)

## 2014-09-20 LAB — COMPREHENSIVE METABOLIC PANEL
ALK PHOS: 50 U/L (ref 39–117)
ALT: 11 U/L (ref 0–35)
AST: 13 U/L (ref 0–37)
Albumin: 4.4 g/dL (ref 3.5–5.2)
BILIRUBIN TOTAL: 0.5 mg/dL (ref 0.2–1.2)
BUN: 12 mg/dL (ref 6–23)
CO2: 28 meq/L (ref 19–32)
CREATININE: 0.66 mg/dL (ref 0.50–1.10)
Calcium: 9.4 mg/dL (ref 8.4–10.5)
Chloride: 102 mEq/L (ref 96–112)
GLUCOSE: 90 mg/dL (ref 70–99)
Potassium: 4.2 mEq/L (ref 3.5–5.3)
SODIUM: 137 meq/L (ref 135–145)
Total Protein: 6.9 g/dL (ref 6.0–8.3)

## 2014-09-20 LAB — TSH: TSH: 1.878 u[IU]/mL (ref 0.350–4.500)

## 2014-09-20 NOTE — Patient Instructions (Signed)
Patient information: High cholesterol (The Basics)  What is cholesterol? - Cholesterol is a substance that is found in the blood. Everyone has some. It is needed for good health. The problem is, people sometimes have too much cholesterol. Compared with people with normal cholesterol, people with high cholesterol have a higher risk of heart attacks, strokes, and other health problems. The higher your cholesterol, the higher your risk of these problems. Cholesterol levels in your body are determined significantly by your diet. Cholesterol levels may also be related to heart disease. The following material helps to explain this relationship and discusses what you can do to help keep your heart healthy. Not all cholesterol is bad. Low-density lipoprotein (LDL) cholesterol is the "bad" cholesterol. It may cause fatty deposits to build up inside your arteries. High-density lipoprotein (HDL) cholesterol is "good." It helps to remove the "bad" LDL cholesterol from your blood. Cholesterol is a very important risk factor for heart disease. Other risk factors are high blood pressure, smoking, stress, heredity, and weight.  The heart muscle gets its supply of blood through the coronary arteries. If your LDL cholesterol is high and your HDL cholesterol is low, you are at risk for having fatty deposits build up in your coronary arteries. This leaves less room through which blood can flow. Without sufficient blood and oxygen, the heart muscle cannot function properly and you may feel chest pains (angina pectoris). When a coronary artery closes up entirely, a part of the heart muscle may die, causing a heart attack (myocardial infarction).  CHECKING CHOLESTEROL When your caregiver sends your blood to a lab to be analyzed for cholesterol, a complete lipid (fat) profile may be done. With this test, the total amount of cholesterol and levels of LDL and HDL are determined. Triglycerides  are a type of fat that circulates in the blood and can also be used to determine heart disease risk. Are there different types of cholesterol? - Yes, there are a few different types. If you get a cholesterol test, you may hear your doctor or nurse talk about: Total cholesterol  LDL cholesterol - Some people call this the "bad" cholesterol. That's because having high LDL levels raises your risk of heart attacks, strokes, and other health problems.  HDL cholesterol - Some people call this the "good" cholesterol. That's because having high HDL levels lowers your risk of heart attacks, strokes, and other health problems.  Non-HDL cholesterol - Non-HDL cholesterol is your total cholesterol minus your HDL cholesterol.  Triglycerides - Triglycerides are not cholesterol. They are a type of fat. But they often get measured when cholesterol is measured. (Having high triglycerides also seems to increase the risk of heart attacks and strokes.)   Keep in mind, though, that many people who cannot meet these goals still have a low risk of heart attacks and strokes. What should I do if my doctor tells me I have high cholesterol? - Ask your doctor what your overall risk of heart attacks and strokes is. High cholesterol, by itself, is not always a reason to worry. Having high cholesterol is just one of many things that can increase your risk of heart attacks and strokes. Other factors that increase your risk include:  Cigarette smoking  High blood pressure  Having a parent, sister, or brother who got heart disease at a young age (Young, in this case, means younger than 55 for men and younger than 65 for women.)  Being a man (Women are at risk, too, but men   have a higher risk.)  Older age  If you are at high risk of heart attacks and strokes, having high cholesterol is a problem. On the other hand, if you have are at low risk, having high cholesterol may not mean much. Should I take medicine to lower cholesterol? - Not  everyone who has high cholesterol needs medicines. Your doctor or nurse will decide if you need them based on your age, family history, and other health concerns.  You should probably take a cholesterol-lowering medicine called a statin if you: Already had a heart attack or stroke  Have known heart disease  Have diabetes  Have a condition called peripheral artery disease, which makes it painful to walk, and happens when the arteries in your legs get clogged with fatty deposits  Have an abdominal aortic aneurysm, which is a widening of the main artery in the belly  Most people with any of the conditions listed above should take a statin no matter what their cholesterol level is. If your doctor or nurse puts you on a statin, stay on it. The medicine may not make you feel any different. But it can help prevent heart attacks, strokes, and death.  Can I lower my cholesterol without medicines? - Yes, you can lower your cholesterol some by:  Avoiding red meat, butter, fried foods, cheese, and other foods that have a lot of saturated fat  Losing weight (if you are overweight)  Being more active Even if these steps do little to change your cholesterol, they can improve your health in many ways.                                                   Cholesterol Control Diet  CONTROLLING CHOLESTEROL WITH DIET Although exercise and lifestyle factors are important, your diet is key. That is because certain foods are known to raise cholesterol and others to lower it. The goal is to balance foods for their effect on cholesterol and more importantly, to replace saturated and trans fat with other types of fat, such as monounsaturated fat, polyunsaturated fat, and omega-3 fatty acids. On average, a person should consume no more than 15 to 17 g of saturated fat daily. Saturated and trans fats are considered "bad" fats, and they will raise LDL cholesterol. Saturated fats are primarily found in animal products such as  meats, butter, and cream. However, that does not mean you need to sacrifice all your favorite foods. Today, there are good tasting, low-fat, low-cholesterol substitutes for most of the things you like to eat. Choose low-fat or nonfat alternatives. Choose round or loin cuts of red meat, since these types of cuts are lowest in fat and cholesterol. Chicken (without the skin), fish, veal, and ground turkey breast are excellent choices. Eliminate fatty meats, such as hot dogs and salami. Even shellfish have little or no saturated fat. Have a 3 oz (85 g) portion when you eat lean meat, poultry, or fish. Trans fats are also called "partially hydrogenated oils." They are oils that have been scientifically manipulated so that they are solid at room temperature resulting in a longer shelf life and improved taste and texture of foods in which they are added. Trans fats are found in stick margarine, some tub margarines, cookies, crackers, and baked goods.  When baking and cooking, oils are an excellent substitute   for butter. The monounsaturated oils are especially beneficial since it is believed they lower LDL and raise HDL. The oils you should avoid entirely are saturated tropical oils, such as coconut and palm.  Remember to eat liberally from food groups that are naturally free of saturated and trans fat, including fish, fruit, vegetables, beans, grains (barley, rice, couscous, bulgur wheat), and pasta (without cream sauces).  IDENTIFYING FOODS THAT LOWER CHOLESTEROL  Soluble fiber may lower your cholesterol. This type of fiber is found in fruits such as apples, vegetables such as broccoli, potatoes, and carrots, legumes such as beans, peas, and lentils, and grains such as barley. Foods fortified with plant sterols (phytosterol) may also lower cholesterol. You should eat at least 2 g per day of these foods for a cholesterol lowering effect.  Read package labels to identify low-saturated fats, trans fats free, and  low-fat foods at the supermarket. Select cheeses that have only 2 to 3 g saturated fat per ounce. Use a heart-healthy tub margarine that is free of trans fats or partially hydrogenated oil. When buying baked goods (cookies, crackers), avoid partially hydrogenated oils. Breads and muffins should be made from whole grains (whole-wheat or whole oat flour, instead of "flour" or "enriched flour"). Buy non-creamy canned soups with reduced salt and no added fats.  FOOD PREPARATION TECHNIQUES  Never deep-fry. If you must fry, either stir-fry, which uses very little fat, or use non-stick cooking sprays. When possible, broil, bake, or roast meats, and steam vegetables. Instead of dressing vegetables with butter or margarine, use lemon and herbs, applesauce and cinnamon (for squash and sweet potatoes), nonfat yogurt, salsa, and low-fat dressings for salads.  LOW-SATURATED FAT / LOW-FAT FOOD SUBSTITUTES Meats / Saturated Fat (g)  Avoid: Steak, marbled (3 oz/85 g) / 11 g   Choose: Steak, lean (3 oz/85 g) / 4 g   Avoid: Hamburger (3 oz/85 g) / 7 g   Choose: Hamburger, lean (3 oz/85 g) / 5 g   Avoid: Ham (3 oz/85 g) / 6 g   Choose: Ham, lean cut (3 oz/85 g) / 2.4 g   Avoid: Chicken, with skin, dark meat (3 oz/85 g) / 4 g   Choose: Chicken, skin removed, dark meat (3 oz/85 g) / 2 g   Avoid: Chicken, with skin, light meat (3 oz/85 g) / 2.5 g   Choose: Chicken, skin removed, light meat (3 oz/85 g) / 1 g  Dairy / Saturated Fat (g)  Avoid: Whole milk (1 cup) / 5 g   Choose: Low-fat milk, 2% (1 cup) / 3 g   Choose: Low-fat milk, 1% (1 cup) / 1.5 g   Choose: Skim milk (1 cup) / 0.3 g   Avoid: Hard cheese (1 oz/28 g) / 6 g   Choose: Skim milk cheese (1 oz/28 g) / 2 to 3 g   Avoid: Cottage cheese, 4% fat (1 cup) / 6.5 g   Choose: Low-fat cottage cheese, 1% fat (1 cup) / 1.5 g   Avoid: Ice cream (1 cup) / 9 g   Choose: Sherbet (1 cup) / 2.5 g   Choose: Nonfat frozen yogurt (1 cup) / 0.3 g    Choose: Frozen fruit bar / trace   Avoid: Whipped cream (1 tbs) / 3.5 g   Choose: Nondairy whipped topping (1 tbs) / 1 g  Condiments / Saturated Fat (g)  Avoid: Mayonnaise (1 tbs) / 2 g   Choose: Low-fat mayonnaise (1 tbs) / 1 g   Avoid:   Butter (1 tbs) / 7 g   Choose: Extra light margarine (1 tbs) / 1 g   Avoid: Coconut oil (1 tbs) / 11.8 g   Choose: Olive oil (1 tbs) / 1.8 g   Choose: Corn oil (1 tbs) / 1.7 g   Choose: Safflower oil (1 tbs) / 1.2 g   Choose: Sunflower oil (1 tbs) / 1.4 g   Choose: Soybean oil (1 tbs) / 2.4 g   Choose: Canola oil (1 tbs) / 1 g  Exercise to Lose Weight Exercise and a healthy diet may help you lose weight. Your doctor may suggest specific exercises. EXERCISE IDEAS AND TIPS  Choose low-cost things you enjoy doing, such as walking, bicycling, or exercising to workout videos.   Take stairs instead of the elevator.   Walk during your lunch break.   Park your car further away from work or school.   Go to a gym or an exercise class.   Start with 5 to 10 minutes of exercise each day. Build up to 30 minutes of exercise 4 to 6 days a week.   Wear shoes with good support and comfortable clothes.   Stretch before and after working out.   Work out until you breathe harder and your heart beats faster.   Drink extra water when you exercise.   Do not do so much that you hurt yourself, feel dizzy, or get very short of breath.  Exercises that burn about 150 calories:  Running 1  miles in 15 minutes.   Playing volleyball for 45 to 60 minutes.   Washing and waxing a car for 45 to 60 minutes.   Playing touch football for 45 minutes.   Walking 1  miles in 35 minutes.   Pushing a stroller 1  miles in 30 minutes.   Playing basketball for 30 minutes.   Raking leaves for 30 minutes.   Bicycling 5 miles in 30 minutes.   Walking 2 miles in 30 minutes.   Dancing for 30 minutes.   Shoveling snow for 15 minutes.   Swimming laps  for 20 minutes.   Walking up stairs for 15 minutes.   Bicycling 4 miles in 15 minutes.   Gardening for 30 to 45 minutes.   Jumping rope for 15 minutes.   Washing windows or floors for 45 to 60 minutes.  Document Released: 08/08/2010 Document Revised: 03/18/2011 Document Reviewed: 08/08/2010 [employer] Patient Information 2012 [employer].                                           

## 2014-09-20 NOTE — Progress Notes (Signed)
Anna Evans 10-05-1975 [id number]   History:    39 y.o.  for annual gyn exam with no complaints today. Patient is fasting and would like to have her blood work drawn today since she has not seen her PCP in over a year. Patient with prior history of transvaginal hysterectomy with ovarian conservation. Patient prior to that has never had any history of any abnormal Pap smears. Later today she scheduled to see the orthopedic surgeon because of painful knees.  Past medical history,surgical history, family history and social history were all reviewed and documented in the EPIC chart.  Gynecologic History No LMP recorded. Patient has had a hysterectomy. Contraception: status post hysterectomy Last Pap: 2012. Results were: normal Last mammogram: Not indicated. Results were: Not indicated  Obstetric History OB History  Gravida Para Term Preterm AB SAB TAB Ectopic Multiple Living  1 1        1     # Outcome Date GA Lbr Len/2nd Weight Sex Delivery Anes PTL Lv  1 Para                ROS: A ROS was performed and pertinent positives and negatives are included in the history.  GENERAL: No fevers or chills. HEENT: No change in vision, no earache, sore throat or sinus congestion. NECK: No pain or stiffness. CARDIOVASCULAR: No chest pain or pressure. No palpitations. PULMONARY: No shortness of breath, cough or wheeze. GASTROINTESTINAL: No abdominal pain, nausea, vomiting or diarrhea, melena or bright red blood per rectum. GENITOURINARY: No urinary frequency, urgency, hesitancy or dysuria. MUSCULOSKELETAL: No joint or muscle pain, no back pain, no recent trauma. DERMATOLOGIC: No rash, no itching, no lesions. ENDOCRINE: No polyuria, polydipsia, no heat or cold intolerance. No recent change in weight. HEMATOLOGICAL: No anemia or easy bruising or bleeding. NEUROLOGIC: No headache, seizures, numbness, tingling or weakness. PSYCHIATRIC: No depression, no loss of interest in normal activity or change in  sleep pattern.     Exam: chaperone present  BP 118/70 mmHg  Ht 5\' 7"  (1.702 m)  Wt 217 lb (98.431 kg)  BMI 33.98 kg/m2  Body mass index is 33.98 kg/(m^2).  General appearance : Well developed well nourished female. No acute distress HEENT: Eyes: no retinal hemorrhage or exudates,  Neck supple, trachea midline, no carotid bruits, no thyroidmegaly Lungs: Clear to auscultation, no rhonchi or wheezes, or rib retractions  Heart: Regular rate and rhythm, no murmurs or gallops Breast:Examined in sitting and supine position were symmetrical in appearance, no palpable masses or tenderness,  no skin retraction, no nipple inversion, no nipple discharge, no skin discoloration, no axillary or supraclavicular lymphadenopathy Abdomen: no palpable masses or tenderness, no rebound or guarding Extremities: no edema or skin discoloration or tenderness  Pelvic:  Bartholin, Urethra, Skene Glands: Within normal limits             Vagina: No gross lesions or discharge  Cervix: Absent  Uterus  absent  Adnexa  Without masses or tenderness  Anus and perineum  normal   Rectovaginal  normal sphincter tone without palpated masses or tenderness             Hemoccult not indicated     Assessment/Plan:  39 y.o. female for annual exam who was counseled on her weight. She is limited because of her painful knees for which she is going to see the orthopedic surgeon today. I provided her with a handout on low-fat diet and regular exercise program. Patient would no longer need  Pap smears according to the new guidelines. The following screening labs were ordered today: CBC, comprehensive metabolic panel, TSH, cholesterol and urinalysis. Patient was reminded to do her monthly breast exams.   Terrance Mass MD, 11:35 AM 09/20/2014

## 2014-09-21 LAB — URINALYSIS W MICROSCOPIC + REFLEX CULTURE
Bacteria, UA: NONE SEEN
Bilirubin Urine: NEGATIVE
CASTS: NONE SEEN
Crystals: NONE SEEN
GLUCOSE, UA: NEGATIVE mg/dL
HGB URINE DIPSTICK: NEGATIVE
KETONES UR: NEGATIVE mg/dL
Leukocytes, UA: NEGATIVE
NITRITE: NEGATIVE
Protein, ur: NEGATIVE mg/dL
Specific Gravity, Urine: 1.018 (ref 1.005–1.030)
Squamous Epithelial / LPF: NONE SEEN
Urobilinogen, UA: 0.2 mg/dL (ref 0.0–1.0)
pH: 5 (ref 5.0–8.0)

## 2014-09-30 ENCOUNTER — Encounter (HOSPITAL_COMMUNITY): Payer: Self-pay | Admitting: *Deleted

## 2014-09-30 ENCOUNTER — Emergency Department (HOSPITAL_COMMUNITY)
Admission: EM | Admit: 2014-09-30 | Discharge: 2014-09-30 | Disposition: A | Discharge status: Home / Self Care | Payer: BLUE CROSS/BLUE SHIELD | Attending: Emergency Medicine | Admitting: Emergency Medicine

## 2014-09-30 ENCOUNTER — Emergency Department (HOSPITAL_COMMUNITY): Payer: BLUE CROSS/BLUE SHIELD

## 2014-09-30 DIAGNOSIS — Z23 Encounter for immunization: Secondary | ICD-10-CM | POA: Diagnosis not present

## 2014-09-30 DIAGNOSIS — S81812A Laceration without foreign body, left lower leg, initial encounter: Secondary | ICD-10-CM | POA: Diagnosis present

## 2014-09-30 DIAGNOSIS — Z8742 Personal history of other diseases of the female genital tract: Secondary | ICD-10-CM | POA: Insufficient documentation

## 2014-09-30 DIAGNOSIS — W228XXA Striking against or struck by other objects, initial encounter: Secondary | ICD-10-CM | POA: Diagnosis not present

## 2014-09-30 DIAGNOSIS — Y998 Other external cause status: Secondary | ICD-10-CM | POA: Insufficient documentation

## 2014-09-30 DIAGNOSIS — Z8669 Personal history of other diseases of the nervous system and sense organs: Secondary | ICD-10-CM | POA: Diagnosis not present

## 2014-09-30 DIAGNOSIS — IMO0002 Reserved for concepts with insufficient information to code with codable children: Secondary | ICD-10-CM

## 2014-09-30 DIAGNOSIS — Y9389 Activity, other specified: Secondary | ICD-10-CM | POA: Diagnosis not present

## 2014-09-30 DIAGNOSIS — Y929 Unspecified place or not applicable: Secondary | ICD-10-CM | POA: Diagnosis not present

## 2014-09-30 MED ORDER — LIDOCAINE HCL (PF) 1 % IJ SOLN
5.0000 mL | Freq: Once | INTRAMUSCULAR | Status: AC
  Administered 2014-09-30: 5 mL via INTRADERMAL
  Filled 2014-09-30: qty 5

## 2014-09-30 MED ORDER — TETANUS-DIPHTH-ACELL PERTUSSIS 5-2.5-18.5 LF-MCG/0.5 IM SUSP
0.5000 mL | Freq: Once | INTRAMUSCULAR | Status: AC
  Administered 2014-09-30: 0.5 mL via INTRAMUSCULAR
  Filled 2014-09-30: qty 0.5

## 2014-09-30 MED ORDER — HYDROCODONE-ACETAMINOPHEN 5-325 MG PO TABS
1.0000 | ORAL_TABLET | Freq: Once | ORAL | Status: AC
  Administered 2014-09-30: 1 via ORAL
  Filled 2014-09-30: qty 1

## 2014-09-30 NOTE — ED Notes (Signed)
Pt returned from X Ray.

## 2014-09-30 NOTE — ED Notes (Signed)
She struck her tibia with a table corner

## 2014-09-30 NOTE — Discharge Instructions (Signed)
Please follow the directions provided.  Be sure to follow-up in 7 days for wound re-check and suture removal.  Keep the wound clean and dry and change your dressing daily.  Don't hesitate to return for any new, worsening or concerning symptoms.    SEEK IMMEDIATE MEDICAL CARE IF:  Your pain is not controlled with prescribed medicine.  You have severe swelling around the wound causing pain and numbness or a change in color in your arm, hand, leg, or foot.  Your wound splits open and starts bleeding.  You have worsening numbness, weakness, or loss of function of any joint around or beyond the wound.  You develop painful lumps near the wound or on the skin anywhere on your body.

## 2014-09-30 NOTE — ED Notes (Signed)
The pt is c/o a small laceration to the lt tibia just below the lt knee

## 2014-09-30 NOTE — ED Provider Notes (Signed)
CSN: [id number]     Arrival date & time 09/30/14  2105 History   First MD Initiated Contact with Patient 09/30/14 2223     Chief Complaint  Patient presents with  . Laceration   (Consider location/radiation/quality/duration/timing/severity/associated sxs/prior Treatment) HPI Anna Evans is a 39 yo female presenting with report of laceration to left leg.  She states about an hour PTA she was moving a end table and it fell.  The corner of her table hit her in the front of her left lower leg.  She was able to control bleeding after holding pressure for a while.  She rates her pain as 4/10. She reports some tenderness around the wound but no difficulty walking or alteration in sensation.    Past Medical History  Diagnosis Date  . Bell's palsy   . Fibroid    Past Surgical History  Procedure Laterality Date  . Abdominal hysterectomy      TVH   Family History  Problem Relation Age of Onset  . Heart disease Father   . Heart disease Paternal Grandmother    History  Substance Use Topics  . Smoking status: Never Smoker   . Smokeless tobacco: Not on file  . Alcohol Use: 0.0 oz/week    0 Standard drinks or equivalent per week     Comment: social   OB History    Gravida Para Term Preterm AB TAB SAB Ectopic Multiple Living   1 1        1      Review of Systems  Musculoskeletal: Positive for myalgias.  Skin: Positive for wound.  Neurological: Negative for weakness.      Allergies  Review of patient's allergies indicates no known allergies.  Home Medications   Prior to Admission medications   Not on File   BP 135/68 mmHg  Pulse 94  Temp(Src) 98.4 F (36.9 C)  Resp 16  Ht 5\' 7"  (1.702 m)  Wt 217 lb (98.431 kg)  BMI 33.98 kg/m2  SpO2 97% Physical Exam  Constitutional: She appears well-developed and well-nourished. No distress.  HENT:  Head: Normocephalic and atraumatic.  Eyes: Conjunctivae are normal.  Neck: Neck supple.  Cardiovascular: Normal rate, regular  rhythm and intact distal pulses.   Pulmonary/Chest: Effort normal and breath sounds normal.  Musculoskeletal: She exhibits tenderness.  Neurological: She is alert.  Skin: Skin is warm and dry. She is not diaphoretic.     2 cm lac to anterior upper third lower left leg  Psychiatric: She has a normal mood and affect.  Nursing note and vitals reviewed.   ED Course  Procedures (including critical care time) LACERATION REPAIR Performed by: Britt Bottom Authorized by: Britt Bottom Consent: Verbal consent obtained. Risks and benefits: risks, benefits and alternatives were discussed Consent given by: patient Patient identity confirmed: provided demographic data Prepped and Draped in normal sterile fashion Wound explored  Laceration Location: anterior left leg  Laceration Length: 2 cm  No Foreign Bodies seen or palpated  Anesthesia: local infiltration  Local anesthetic: lidocaine 1% without epinephrine  Anesthetic total: 3 ml  Irrigation method: syringe Amount of cleaning: standard  Skin closure: 4-0 prolene  Number of sutures: 2  Technique: simple interrupted  Patient tolerance: Patient tolerated the procedure well with no immediate complications.   Labs Review Labs Reviewed - No data to display  Imaging Review No results found.   EKG Interpretation None      MDM   Final diagnoses:  Laceration   38  yo with laceration, x-ray negative for fracture. The laceration occurred < 2 hours prior to repair.  Tdap booster given. Pressure irrigation performed and repair was well tolerated.  Discussed suture home care w pt and answered questions. Pt is well-appearing, in no acute distress and vital signs reviewed and not concerning. She appears safe to be discharged.  Discharge include return in 7 days for suture removal. Return precautions provided and pt aware of plan.      Filed Vitals:   09/30/14 2114 09/30/14 2343  BP: 135/68 101/55  Pulse: 94 82    Temp: 98.4 F (36.9 C)   Resp: 16 14  Height: 5\' 7"  (1.702 m)   Weight: 217 lb (98.431 kg)   SpO2: 97% 97%   Meds given in ED:  Medications  HYDROcodone-acetaminophen (NORCO/VICODIN) 5-325 MG per tablet 1 tablet (1 tablet Oral Given 09/30/14 2235)  Tdap (BOOSTRIX) injection 0.5 mL (0.5 mLs Intramuscular Given 09/30/14 2235)  lidocaine (PF) (XYLOCAINE) 1 % injection 5 mL (5 mLs Intradermal Given by Other 09/30/14 2258)    There are no discharge medications for this patient.      Britt Bottom, NP 10/02/14 Dwight Mission, MD 10/04/14 [phone]

## 2014-09-30 NOTE — ED Notes (Signed)
Patient transported to X-ray 

## 2014-09-30 NOTE — ED Notes (Signed)
Suture cart and wound care at bedside.

## 2014-09-30 NOTE — ED Notes (Signed)
Suture cart at bedside 

## 2016-08-24 ENCOUNTER — Encounter: Payer: Self-pay | Admitting: Gynecology

## 2016-08-24 ENCOUNTER — Ambulatory Visit (INDEPENDENT_AMBULATORY_CARE_PROVIDER_SITE_OTHER): Payer: 59

## 2016-08-24 ENCOUNTER — Ambulatory Visit (INDEPENDENT_AMBULATORY_CARE_PROVIDER_SITE_OTHER): Payer: 59 | Admitting: Gynecology

## 2016-08-24 ENCOUNTER — Other Ambulatory Visit: Payer: Self-pay | Admitting: Gynecology

## 2016-08-24 ENCOUNTER — Telehealth: Payer: Self-pay | Admitting: *Deleted

## 2016-08-24 VITALS — BP 124/80 | Ht 67.0 in | Wt 217.0 lb

## 2016-08-24 DIAGNOSIS — R1032 Left lower quadrant pain: Secondary | ICD-10-CM | POA: Insufficient documentation

## 2016-08-24 LAB — URINALYSIS W MICROSCOPIC + REFLEX CULTURE
BILIRUBIN URINE: NEGATIVE
Bacteria, UA: NONE SEEN [HPF]
CRYSTALS: NONE SEEN [HPF]
Casts: NONE SEEN [LPF]
GLUCOSE, UA: NEGATIVE
KETONES UR: NEGATIVE
Leukocytes, UA: NEGATIVE
Nitrite: NEGATIVE
PROTEIN: NEGATIVE
RBC / HPF: NONE SEEN RBC/HPF (ref <=2)
Specific Gravity, Urine: 1.015 (ref 1.001–1.035)
Yeast: NONE SEEN [HPF]
pH: 5.5 (ref 5.0–8.0)

## 2016-08-24 MED ORDER — KETOROLAC TROMETHAMINE 30 MG/ML IJ SOLN
30.0000 mg | Freq: Once | INTRAMUSCULAR | Status: AC
  Administered 2016-08-24: 30 mg via INTRAVENOUS

## 2016-08-24 MED ORDER — KETOROLAC TROMETHAMINE 10 MG PO TABS: 10.0000 mg | ORAL_TABLET | Freq: Four times a day (QID) | ORAL | 0 refills | Status: DC | PRN

## 2016-08-24 NOTE — Progress Notes (Signed)
   Patient is a 41 year old that presented to the office today stating that for the past 2 days she's been complaining of worsening left lower quadrant discomfort. She does have history of irritable bowel syndrome. She has bowel movements every other day. She denies any blood in her stool. Patient with prior history of vaginal hysterectomy with ovarian conservation has not been seen the office since March 2016. She states when she void she feels that she doesn't empty completely but denies any dysuria or any back pain. She denies any fever, chills, nausea, or vomiting.  Exam: Back: No CVA tenderness Abdomen tenderness left lower quadrant with mild rebound Pelvic: Bartholin urethra Skene was within normal limits Vaginal cuff intact Bimanual exam right adnexa no masses or tenderness Left adnexa tenderness unable to deliver needed mass. Patient's discomfort Rectal exam deferred  Urinalysis today: Negative  Ultrasound today: Absent uterus. Right ovary normal follicle 9 mm. Left ovary difficult to image transversely. Transabdominal images left ovary appears normal positive blood flow seen to the left ovary and arterial blood flow to the right ovary. No apparent adnexal masses  Patient been given 30 mg of Toradol IM and have improved most of her symptom.  Assessment/plan: 3 days of left lower abdominal discomfort. She had good bowel sounds in all quadrants abdomen was soft tender on deep palpation. Initially there was a question whether she may have a torsed adnexa but blood flow was seen to both ovaries. Patient will be prescribed Toradol 10 mg take 1 by mouth every 6 hours for next few days. I've instructed to remain nothing by mouth after midnight in case her pain and have not improved we'll plan on laparoscopic evaluation and possible left adnexectomy with right salpingectomy. If symptoms were to worsen tonight she will report to the emergency room at [facility] hospital.

## 2016-08-24 NOTE — Telephone Encounter (Signed)
Pt aware, Rx sent. 

## 2016-08-24 NOTE — Telephone Encounter (Signed)
Please call in prescription for Toradol 10 mg 1 by mouth every 6 hours #30

## 2016-08-24 NOTE — Telephone Encounter (Signed)
Pt was seen today and prescribed "Toradol 10 mg take 1 by mouth every 6 hours for next few days." pt went to pharmacy and Rx is not there, wrong Rx was prescribed for Toradol injection in office note. What is the quantity for the Toradol 10 mg tablets? Please advise

## 2016-08-25 LAB — URINE CULTURE: Organism ID, Bacteria: NO GROWTH

## 2016-10-27 DIAGNOSIS — L820 Inflamed seborrheic keratosis: Secondary | ICD-10-CM | POA: Diagnosis not present

## 2016-11-18 DIAGNOSIS — Z79899 Other long term (current) drug therapy: Secondary | ICD-10-CM | POA: Diagnosis not present

## 2016-12-02 ENCOUNTER — Encounter: Payer: Self-pay | Admitting: Gynecology

## 2017-02-03 ENCOUNTER — Other Ambulatory Visit: Payer: Self-pay | Admitting: *Deleted

## 2017-03-16 DIAGNOSIS — Z79899 Other long term (current) drug therapy: Secondary | ICD-10-CM | POA: Diagnosis not present

## 2017-03-31 DIAGNOSIS — T63624A Toxic effect of contact with other jellyfish, undetermined, initial encounter: Secondary | ICD-10-CM | POA: Diagnosis not present

## 2017-03-31 DIAGNOSIS — Z23 Encounter for immunization: Secondary | ICD-10-CM | POA: Diagnosis not present

## 2017-07-27 DIAGNOSIS — M179 Osteoarthritis of knee, unspecified: Secondary | ICD-10-CM | POA: Diagnosis not present

## 2017-08-05 DIAGNOSIS — M2242 Chondromalacia patellae, left knee: Secondary | ICD-10-CM | POA: Diagnosis not present

## 2017-08-05 DIAGNOSIS — M2241 Chondromalacia patellae, right knee: Secondary | ICD-10-CM | POA: Diagnosis not present

## 2017-09-09 ENCOUNTER — Other Ambulatory Visit: Payer: Self-pay | Admitting: Family Medicine

## 2017-09-09 DIAGNOSIS — Z0000 Encounter for general adult medical examination without abnormal findings: Secondary | ICD-10-CM | POA: Diagnosis not present

## 2017-09-09 DIAGNOSIS — Z139 Encounter for screening, unspecified: Secondary | ICD-10-CM

## 2017-09-09 DIAGNOSIS — J069 Acute upper respiratory infection, unspecified: Secondary | ICD-10-CM | POA: Diagnosis not present

## 2017-09-09 DIAGNOSIS — E669 Obesity, unspecified: Secondary | ICD-10-CM | POA: Diagnosis not present

## 2017-09-09 DIAGNOSIS — Z23 Encounter for immunization: Secondary | ICD-10-CM | POA: Diagnosis not present

## 2017-09-29 ENCOUNTER — Ambulatory Visit
Admission: RE | Admit: 2017-09-29 | Discharge: 2017-09-29 | Disposition: A | Discharge status: Home / Self Care | Payer: 59 | Source: Ambulatory Visit | Attending: Family Medicine | Admitting: Family Medicine

## 2017-09-29 DIAGNOSIS — Z1231 Encounter for screening mammogram for malignant neoplasm of breast: Secondary | ICD-10-CM | POA: Diagnosis not present

## 2017-09-29 DIAGNOSIS — Z139 Encounter for screening, unspecified: Secondary | ICD-10-CM

## 2017-10-22 DIAGNOSIS — R0981 Nasal congestion: Secondary | ICD-10-CM | POA: Diagnosis not present

## 2017-12-07 DIAGNOSIS — M25572 Pain in left ankle and joints of left foot: Secondary | ICD-10-CM | POA: Diagnosis not present

## 2018-01-27 DIAGNOSIS — M25562 Pain in left knee: Secondary | ICD-10-CM | POA: Diagnosis not present

## 2018-03-10 DIAGNOSIS — M25562 Pain in left knee: Secondary | ICD-10-CM | POA: Diagnosis not present

## 2018-03-11 DIAGNOSIS — M25562 Pain in left knee: Secondary | ICD-10-CM | POA: Diagnosis not present

## 2018-03-29 DIAGNOSIS — M1712 Unilateral primary osteoarthritis, left knee: Secondary | ICD-10-CM | POA: Diagnosis not present

## 2018-04-05 DIAGNOSIS — M1712 Unilateral primary osteoarthritis, left knee: Secondary | ICD-10-CM | POA: Diagnosis not present

## 2018-04-12 DIAGNOSIS — M1712 Unilateral primary osteoarthritis, left knee: Secondary | ICD-10-CM | POA: Diagnosis not present

## 2018-06-29 DIAGNOSIS — M2242 Chondromalacia patellae, left knee: Secondary | ICD-10-CM | POA: Diagnosis not present

## 2018-06-29 DIAGNOSIS — M1712 Unilateral primary osteoarthritis, left knee: Secondary | ICD-10-CM | POA: Diagnosis not present

## 2018-06-29 DIAGNOSIS — S83012A Lateral subluxation of left patella, initial encounter: Secondary | ICD-10-CM | POA: Diagnosis not present

## 2018-06-29 DIAGNOSIS — G8918 Other acute postprocedural pain: Secondary | ICD-10-CM | POA: Diagnosis not present

## 2018-07-04 DIAGNOSIS — M222X2 Patellofemoral disorders, left knee: Secondary | ICD-10-CM | POA: Diagnosis not present

## 2018-07-04 DIAGNOSIS — M1712 Unilateral primary osteoarthritis, left knee: Secondary | ICD-10-CM | POA: Diagnosis not present

## 2018-07-04 DIAGNOSIS — M25662 Stiffness of left knee, not elsewhere classified: Secondary | ICD-10-CM | POA: Diagnosis not present

## 2018-07-07 DIAGNOSIS — M222X2 Patellofemoral disorders, left knee: Secondary | ICD-10-CM | POA: Diagnosis not present

## 2018-07-07 DIAGNOSIS — M25662 Stiffness of left knee, not elsewhere classified: Secondary | ICD-10-CM | POA: Diagnosis not present

## 2018-07-07 DIAGNOSIS — M1712 Unilateral primary osteoarthritis, left knee: Secondary | ICD-10-CM | POA: Diagnosis not present

## 2018-07-14 DIAGNOSIS — M1712 Unilateral primary osteoarthritis, left knee: Secondary | ICD-10-CM | POA: Diagnosis not present

## 2018-07-14 DIAGNOSIS — M222X2 Patellofemoral disorders, left knee: Secondary | ICD-10-CM | POA: Diagnosis not present

## 2018-07-14 DIAGNOSIS — M25662 Stiffness of left knee, not elsewhere classified: Secondary | ICD-10-CM | POA: Diagnosis not present

## 2018-07-21 DIAGNOSIS — M222X2 Patellofemoral disorders, left knee: Secondary | ICD-10-CM | POA: Diagnosis not present

## 2018-07-21 DIAGNOSIS — M25662 Stiffness of left knee, not elsewhere classified: Secondary | ICD-10-CM | POA: Diagnosis not present

## 2018-07-21 DIAGNOSIS — M1712 Unilateral primary osteoarthritis, left knee: Secondary | ICD-10-CM | POA: Diagnosis not present

## 2018-07-28 DIAGNOSIS — M222X2 Patellofemoral disorders, left knee: Secondary | ICD-10-CM | POA: Diagnosis not present

## 2018-07-28 DIAGNOSIS — M25662 Stiffness of left knee, not elsewhere classified: Secondary | ICD-10-CM | POA: Diagnosis not present

## 2018-07-28 DIAGNOSIS — M1712 Unilateral primary osteoarthritis, left knee: Secondary | ICD-10-CM | POA: Diagnosis not present

## 2018-08-02 DIAGNOSIS — M222X2 Patellofemoral disorders, left knee: Secondary | ICD-10-CM | POA: Diagnosis not present

## 2018-08-02 DIAGNOSIS — M1712 Unilateral primary osteoarthritis, left knee: Secondary | ICD-10-CM | POA: Diagnosis not present

## 2018-08-02 DIAGNOSIS — M25662 Stiffness of left knee, not elsewhere classified: Secondary | ICD-10-CM | POA: Diagnosis not present

## 2018-08-04 DIAGNOSIS — M1712 Unilateral primary osteoarthritis, left knee: Secondary | ICD-10-CM | POA: Diagnosis not present

## 2018-08-04 DIAGNOSIS — M222X2 Patellofemoral disorders, left knee: Secondary | ICD-10-CM | POA: Diagnosis not present

## 2018-08-04 DIAGNOSIS — M25662 Stiffness of left knee, not elsewhere classified: Secondary | ICD-10-CM | POA: Diagnosis not present

## 2018-08-05 DIAGNOSIS — L814 Other melanin hyperpigmentation: Secondary | ICD-10-CM | POA: Diagnosis not present

## 2018-08-05 DIAGNOSIS — L718 Other rosacea: Secondary | ICD-10-CM | POA: Diagnosis not present

## 2018-08-05 DIAGNOSIS — D225 Melanocytic nevi of trunk: Secondary | ICD-10-CM | POA: Diagnosis not present

## 2018-08-19 ENCOUNTER — Other Ambulatory Visit: Payer: Self-pay | Admitting: Family Medicine

## 2018-08-19 DIAGNOSIS — Z1231 Encounter for screening mammogram for malignant neoplasm of breast: Secondary | ICD-10-CM

## 2018-09-19 ENCOUNTER — Other Ambulatory Visit (HOSPITAL_COMMUNITY)
Admission: RE | Admit: 2018-09-19 | Discharge: 2018-09-19 | Disposition: A | Discharge status: Home / Self Care | Payer: 59 | Source: Ambulatory Visit | Attending: Family Medicine | Admitting: Family Medicine

## 2018-09-19 ENCOUNTER — Other Ambulatory Visit: Payer: Self-pay | Admitting: Family Medicine

## 2018-09-19 DIAGNOSIS — Z01419 Encounter for gynecological examination (general) (routine) without abnormal findings: Secondary | ICD-10-CM | POA: Diagnosis not present

## 2018-09-19 DIAGNOSIS — Z13220 Encounter for screening for lipoid disorders: Secondary | ICD-10-CM | POA: Diagnosis not present

## 2018-09-19 DIAGNOSIS — Z0000 Encounter for general adult medical examination without abnormal findings: Secondary | ICD-10-CM | POA: Diagnosis not present

## 2018-09-19 DIAGNOSIS — E669 Obesity, unspecified: Secondary | ICD-10-CM | POA: Diagnosis not present

## 2018-09-21 LAB — CYTOLOGY - PAP
Adequacy: ABSENT
Diagnosis: NEGATIVE

## 2018-10-03 ENCOUNTER — Ambulatory Visit: Payer: 59

## 2018-11-09 ENCOUNTER — Ambulatory Visit: Payer: 59

## 2019-01-02 ENCOUNTER — Other Ambulatory Visit: Payer: Self-pay

## 2019-01-02 ENCOUNTER — Ambulatory Visit
Admission: RE | Admit: 2019-01-02 | Discharge: 2019-01-02 | Disposition: A | Discharge status: Home / Self Care | Payer: 59 | Source: Ambulatory Visit | Attending: Family Medicine | Admitting: Family Medicine

## 2019-01-02 DIAGNOSIS — Z1231 Encounter for screening mammogram for malignant neoplasm of breast: Secondary | ICD-10-CM

## 2019-01-03 ENCOUNTER — Other Ambulatory Visit: Payer: Self-pay | Admitting: Family Medicine

## 2019-01-03 DIAGNOSIS — R928 Other abnormal and inconclusive findings on diagnostic imaging of breast: Secondary | ICD-10-CM

## 2019-01-05 ENCOUNTER — Ambulatory Visit
Admission: RE | Admit: 2019-01-05 | Discharge: 2019-01-05 | Disposition: A | Discharge status: Home / Self Care | Payer: 59 | Source: Ambulatory Visit | Attending: Family Medicine | Admitting: Family Medicine

## 2019-01-05 ENCOUNTER — Other Ambulatory Visit: Payer: Self-pay

## 2019-01-05 DIAGNOSIS — R928 Other abnormal and inconclusive findings on diagnostic imaging of breast: Secondary | ICD-10-CM

## 2020-10-04 ENCOUNTER — Other Ambulatory Visit: Payer: Self-pay | Admitting: Family Medicine

## 2020-10-04 DIAGNOSIS — Z1231 Encounter for screening mammogram for malignant neoplasm of breast: Secondary | ICD-10-CM

## 2020-11-26 ENCOUNTER — Ambulatory Visit: Payer: 59

## 2020-12-30 ENCOUNTER — Emergency Department (HOSPITAL_COMMUNITY): Payer: 59 | Admitting: Anesthesiology

## 2020-12-30 ENCOUNTER — Other Ambulatory Visit: Payer: Self-pay

## 2020-12-30 ENCOUNTER — Emergency Department (HOSPITAL_BASED_OUTPATIENT_CLINIC_OR_DEPARTMENT_OTHER): Payer: 59

## 2020-12-30 ENCOUNTER — Encounter (HOSPITAL_COMMUNITY)
Admission: EM | Disposition: A | Discharge status: Home / Self Care | Payer: Self-pay | Source: Home / Self Care | Attending: Emergency Medicine

## 2020-12-30 ENCOUNTER — Ambulatory Visit (HOSPITAL_BASED_OUTPATIENT_CLINIC_OR_DEPARTMENT_OTHER)
Admission: EM | Admit: 2020-12-30 | Discharge: 2020-12-30 | Disposition: A | Discharge status: Home / Self Care | Payer: 59 | Attending: Emergency Medicine | Admitting: Emergency Medicine

## 2020-12-30 ENCOUNTER — Encounter (HOSPITAL_BASED_OUTPATIENT_CLINIC_OR_DEPARTMENT_OTHER): Payer: Self-pay

## 2020-12-30 DIAGNOSIS — G510 Bell's palsy: Secondary | ICD-10-CM | POA: Diagnosis not present

## 2020-12-30 DIAGNOSIS — Z90710 Acquired absence of both cervix and uterus: Secondary | ICD-10-CM | POA: Diagnosis not present

## 2020-12-30 DIAGNOSIS — D72829 Elevated white blood cell count, unspecified: Secondary | ICD-10-CM

## 2020-12-30 DIAGNOSIS — Z20822 Contact with and (suspected) exposure to covid-19: Secondary | ICD-10-CM | POA: Diagnosis not present

## 2020-12-30 DIAGNOSIS — K8012 Calculus of gallbladder with acute and chronic cholecystitis without obstruction: Secondary | ICD-10-CM | POA: Diagnosis not present

## 2020-12-30 DIAGNOSIS — Z8249 Family history of ischemic heart disease and other diseases of the circulatory system: Secondary | ICD-10-CM | POA: Diagnosis not present

## 2020-12-30 DIAGNOSIS — Z9884 Bariatric surgery status: Secondary | ICD-10-CM | POA: Insufficient documentation

## 2020-12-30 DIAGNOSIS — K810 Acute cholecystitis: Secondary | ICD-10-CM | POA: Diagnosis present

## 2020-12-30 DIAGNOSIS — K219 Gastro-esophageal reflux disease without esophagitis: Secondary | ICD-10-CM | POA: Insufficient documentation

## 2020-12-30 DIAGNOSIS — Z880 Allergy status to penicillin: Secondary | ICD-10-CM | POA: Insufficient documentation

## 2020-12-30 HISTORY — PX: CHOLECYSTECTOMY: SHX55

## 2020-12-30 LAB — COMPREHENSIVE METABOLIC PANEL
ALT: 10 U/L (ref 0–44)
AST: 17 U/L (ref 15–41)
Albumin: 4.6 g/dL (ref 3.5–5.0)
Alkaline Phosphatase: 60 U/L (ref 38–126)
Anion gap: 11 (ref 5–15)
BUN: 13 mg/dL (ref 6–20)
CO2: 25 mmol/L (ref 22–32)
Calcium: 9.3 mg/dL (ref 8.9–10.3)
Chloride: 102 mmol/L (ref 98–111)
Creatinine, Ser: 0.7 mg/dL (ref 0.44–1.00)
GFR, Estimated: >60 mL/min (ref >60)
Glucose, Bld: 118 mg/dL — ABNORMAL HIGH (ref 70–99)
Potassium: 3.8 mmol/L (ref 3.5–5.1)
Sodium: 138 mmol/L (ref 135–145)
Total Bilirubin: 0.4 mg/dL (ref 0.3–1.2)
Total Protein: 7.5 g/dL (ref 6.5–8.1)

## 2020-12-30 LAB — URINALYSIS, ROUTINE W REFLEX MICROSCOPIC
Bilirubin Urine: NEGATIVE
Glucose, UA: NEGATIVE mg/dL
Ketones, ur: NEGATIVE mg/dL
Leukocytes,Ua: NEGATIVE
Nitrite: NEGATIVE
Protein, ur: NEGATIVE mg/dL
Specific Gravity, Urine: 1.023 (ref 1.005–1.030)
pH: 5.5 (ref 5.0–8.0)

## 2020-12-30 LAB — CBC WITH DIFFERENTIAL/PLATELET
Abs Immature Granulocytes: 0.02 10*3/uL (ref 0.00–0.07)
Basophils Absolute: 0.1 10*3/uL (ref 0.0–0.1)
Basophils Relative: 1 %
Eosinophils Absolute: 0.6 10*3/uL — ABNORMAL HIGH (ref 0.0–0.5)
Eosinophils Relative: 5 %
HCT: 41.6 % (ref 36.0–46.0)
Hemoglobin: 14 g/dL (ref 12.0–15.0)
Immature Granulocytes: 0 %
Lymphocytes Relative: 31 %
Lymphs Abs: 3.4 10*3/uL (ref 0.7–4.0)
MCH: 29.9 pg (ref 26.0–34.0)
MCHC: 33.7 g/dL (ref 30.0–36.0)
MCV: 88.9 fL (ref 80.0–100.0)
Monocytes Absolute: 0.7 10*3/uL (ref 0.1–1.0)
Monocytes Relative: 7 %
Neutro Abs: 6.1 10*3/uL (ref 1.7–7.7)
Neutrophils Relative %: 56 %
Platelets: 348 10*3/uL (ref 150–400)
RBC: 4.68 MIL/uL (ref 3.87–5.11)
RDW: 13.1 % (ref 11.5–15.5)
WBC: 10.9 10*3/uL — ABNORMAL HIGH (ref 4.0–10.5)
nRBC: 0 % (ref 0.0–0.2)

## 2020-12-30 LAB — PREGNANCY, URINE: Preg Test, Ur: NEGATIVE

## 2020-12-30 LAB — RESP PANEL BY RT-PCR (FLU A&B, COVID) ARPGX2
Influenza A by PCR: NEGATIVE
Influenza B by PCR: NEGATIVE
SARS Coronavirus 2 by RT PCR: NEGATIVE

## 2020-12-30 LAB — LIPASE, BLOOD: Lipase: 11 U/L (ref 11–51)

## 2020-12-30 SURGERY — LAPAROSCOPIC CHOLECYSTECTOMY WITH INTRAOPERATIVE CHOLANGIOGRAM
Anesthesia: General | Site: Abdomen

## 2020-12-30 MED ORDER — HEMOSTATIC AGENTS (NO CHARGE) OPTIME
TOPICAL | Status: DC | PRN
  Administered 2020-12-30: 1 via TOPICAL

## 2020-12-30 MED ORDER — OXYCODONE HCL 5 MG PO TABS: 5.0000 mg | ORAL_TABLET | Freq: Once | ORAL | Status: DC | PRN

## 2020-12-30 MED ORDER — ORAL CARE MOUTH RINSE: 15.0000 mL | Freq: Once | OROMUCOSAL | Status: AC

## 2020-12-30 MED ORDER — IOHEXOL 300 MG/ML  SOLN
100.0000 mL | Freq: Once | INTRAMUSCULAR | Status: AC | PRN
  Administered 2020-12-30: 100 mL via INTRAVENOUS

## 2020-12-30 MED ORDER — LIDOCAINE HCL (PF) 2 % IJ SOLN
INTRAMUSCULAR | Status: DC | PRN
  Administered 2020-12-30: 100 mg via INTRADERMAL

## 2020-12-30 MED ORDER — FENTANYL CITRATE (PF) 250 MCG/5ML IJ SOLN
INTRAMUSCULAR | Status: DC | PRN
  Administered 2020-12-30: 50 ug via INTRAVENOUS
  Administered 2020-12-30: 150 ug via INTRAVENOUS
  Administered 2020-12-30: 50 ug via INTRAVENOUS

## 2020-12-30 MED ORDER — FENTANYL CITRATE (PF) 100 MCG/2ML IJ SOLN
50.0000 ug | Freq: Once | INTRAMUSCULAR | Status: AC
  Administered 2020-12-30: 50 ug via INTRAVENOUS
  Filled 2020-12-30: qty 2

## 2020-12-30 MED ORDER — CEFTRIAXONE SODIUM 2 G IJ SOLR
2.0000 g | Freq: Once | INTRAMUSCULAR | Status: AC
  Administered 2020-12-30: 2 g via INTRAVENOUS
  Filled 2020-12-30: qty 20

## 2020-12-30 MED ORDER — ONDANSETRON HCL 4 MG/2ML IJ SOLN
INTRAMUSCULAR | Status: DC | PRN
  Administered 2020-12-30: 4 mg via INTRAVENOUS

## 2020-12-30 MED ORDER — BUPIVACAINE-EPINEPHRINE (PF) 0.25% -1:200000 IJ SOLN
INTRAMUSCULAR | Status: AC
  Filled 2020-12-30: qty 30

## 2020-12-30 MED ORDER — PHENYLEPHRINE HCL-NACL 10-0.9 MG/250ML-% IV SOLN
INTRAVENOUS | Status: DC | PRN
  Administered 2020-12-30: 30 ug/min via INTRAVENOUS

## 2020-12-30 MED ORDER — MIDAZOLAM HCL 5 MG/5ML IJ SOLN
INTRAMUSCULAR | Status: DC | PRN
  Administered 2020-12-30: 2 mg via INTRAVENOUS

## 2020-12-30 MED ORDER — AMISULPRIDE (ANTIEMETIC) 5 MG/2ML IV SOLN: 10.0000 mg | Freq: Once | INTRAVENOUS | Status: DC | PRN

## 2020-12-30 MED ORDER — ROCURONIUM BROMIDE 10 MG/ML (PF) SYRINGE
PREFILLED_SYRINGE | INTRAVENOUS | Status: DC | PRN
  Administered 2020-12-30: 60 mg via INTRAVENOUS

## 2020-12-30 MED ORDER — ACETAMINOPHEN 500 MG PO TABS
1000.0000 mg | ORAL_TABLET | ORAL | Status: AC
  Administered 2020-12-30: 1000 mg via ORAL
  Filled 2020-12-30: qty 2

## 2020-12-30 MED ORDER — BUPIVACAINE-EPINEPHRINE 0.25% -1:200000 IJ SOLN
INTRAMUSCULAR | Status: DC | PRN
  Administered 2020-12-30: 18 mL

## 2020-12-30 MED ORDER — CHLORHEXIDINE GLUCONATE 0.12 % MT SOLN
15.0000 mL | Freq: Once | OROMUCOSAL | Status: AC
  Administered 2020-12-30: 15 mL via OROMUCOSAL
  Filled 2020-12-30: qty 15

## 2020-12-30 MED ORDER — SUGAMMADEX SODIUM 200 MG/2ML IV SOLN
INTRAVENOUS | Status: DC | PRN
  Administered 2020-12-30: 200 mg via INTRAVENOUS

## 2020-12-30 MED ORDER — PROPOFOL 10 MG/ML IV BOLUS
INTRAVENOUS | Status: DC | PRN
  Administered 2020-12-30: 200 mg via INTRAVENOUS

## 2020-12-30 MED ORDER — MIDAZOLAM HCL 2 MG/2ML IJ SOLN
INTRAMUSCULAR | Status: AC
  Filled 2020-12-30: qty 2

## 2020-12-30 MED ORDER — ONDANSETRON HCL 4 MG/2ML IJ SOLN: 4.0000 mg | Freq: Once | INTRAMUSCULAR | Status: DC | PRN

## 2020-12-30 MED ORDER — PROPOFOL 10 MG/ML IV BOLUS
INTRAVENOUS | Status: AC
  Filled 2020-12-30: qty 20

## 2020-12-30 MED ORDER — OXYCODONE HCL 5 MG PO TABS: 5.0000 mg | ORAL_TABLET | Freq: Four times a day (QID) | ORAL | 0 refills | Status: DC | PRN

## 2020-12-30 MED ORDER — HYDROMORPHONE HCL 1 MG/ML IJ SOLN: 0.2500 mg | INTRAMUSCULAR | Status: DC | PRN

## 2020-12-30 MED ORDER — OXYCODONE HCL 5 MG/5ML PO SOLN
5.0000 mg | Freq: Once | ORAL | Status: DC | PRN
Start: 2020-12-30 — End: 2020-12-30

## 2020-12-30 MED ORDER — DEXAMETHASONE SODIUM PHOSPHATE 10 MG/ML IJ SOLN
INTRAMUSCULAR | Status: DC | PRN
  Administered 2020-12-30: 10 mg via INTRAVENOUS

## 2020-12-30 MED ORDER — FENTANYL CITRATE (PF) 250 MCG/5ML IJ SOLN
INTRAMUSCULAR | Status: AC
  Filled 2020-12-30: qty 5

## 2020-12-30 MED ORDER — SODIUM CHLORIDE 0.9 % IR SOLN
Status: DC | PRN
  Administered 2020-12-30: 1000 mL

## 2020-12-30 MED ORDER — ONDANSETRON HCL 4 MG/2ML IJ SOLN
4.0000 mg | Freq: Once | INTRAMUSCULAR | Status: AC
  Administered 2020-12-30: 4 mg via INTRAVENOUS
  Filled 2020-12-30: qty 2

## 2020-12-30 MED ORDER — LACTATED RINGERS IV SOLN: INTRAVENOUS | Status: DC

## 2020-12-30 MED ORDER — ACETAMINOPHEN 10 MG/ML IV SOLN: 1000.0000 mg | Freq: Once | INTRAVENOUS | Status: DC | PRN

## 2020-12-30 MED ORDER — 0.9 % SODIUM CHLORIDE (POUR BTL) OPTIME
TOPICAL | Status: DC | PRN
  Administered 2020-12-30: 1000 mL

## 2020-12-30 SURGICAL SUPPLY — 47 items
ADH SKN CLS APL DERMABOND .7 (GAUZE/BANDAGES/DRESSINGS) ×1
APL PRP STRL LF DISP 70% ISPRP (MISCELLANEOUS) ×1
APPLIER CLIP 5 13 M/L LIGAMAX5 (MISCELLANEOUS) ×2
APR CLP MED LRG 5 ANG JAW (MISCELLANEOUS) ×1
BAG SPEC RTRVL 10 TROC 200 (ENDOMECHANICALS) ×1
BLADE CLIPPER SURG (BLADE) IMPLANT
CANISTER SUCT 3000ML PPV (MISCELLANEOUS) ×2 IMPLANT
CHLORAPREP W/TINT 26 (MISCELLANEOUS) ×2 IMPLANT
CLIP APPLIE 5 13 M/L LIGAMAX5 (MISCELLANEOUS) ×1 IMPLANT
COVER MAYO STAND STRL (DRAPES) ×2 IMPLANT
COVER SURGICAL LIGHT HANDLE (MISCELLANEOUS) ×2 IMPLANT
DERMABOND ADVANCED (GAUZE/BANDAGES/DRESSINGS) ×1
DERMABOND ADVANCED .7 DNX12 (GAUZE/BANDAGES/DRESSINGS) ×1 IMPLANT
DRAPE C-ARM 42X120 X-RAY (DRAPES) ×2 IMPLANT
ELECT REM PT RETURN 9FT ADLT (ELECTROSURGICAL) ×2
ELECTRODE REM PT RTRN 9FT ADLT (ELECTROSURGICAL) ×1 IMPLANT
GLOVE BIO SURGEON STRL SZ8 (GLOVE) ×2 IMPLANT
GLOVE SRG 8 PF TXTR STRL LF DI (GLOVE) ×1 IMPLANT
GLOVE SURG UNDER POLY LF SZ8 (GLOVE) ×2
GOWN STRL REUS W/ TWL LRG LVL3 (GOWN DISPOSABLE) ×2 IMPLANT
GOWN STRL REUS W/ TWL XL LVL3 (GOWN DISPOSABLE) ×1 IMPLANT
GOWN STRL REUS W/TWL LRG LVL3 (GOWN DISPOSABLE) ×4
GOWN STRL REUS W/TWL XL LVL3 (GOWN DISPOSABLE) ×2
HEMOSTAT SNOW SURGICEL 2X4 (HEMOSTASIS) ×1 IMPLANT
KIT BASIN OR (CUSTOM PROCEDURE TRAY) ×2 IMPLANT
KIT TURNOVER KIT B (KITS) ×2 IMPLANT
L-HOOK LAP DISP 36CM (ELECTROSURGICAL) ×2
LHOOK LAP DISP 36CM (ELECTROSURGICAL) ×1 IMPLANT
NEEDLE 22X1 1/2 (OR ONLY) (NEEDLE) ×2 IMPLANT
NS IRRIG 1000ML POUR BTL (IV SOLUTION) ×2 IMPLANT
PAD ARMBOARD 7.5X6 YLW CONV (MISCELLANEOUS) ×2 IMPLANT
PENCIL BUTTON HOLSTER BLD 10FT (ELECTRODE) ×2 IMPLANT
POUCH RETRIEVAL ECOSAC 10 (ENDOMECHANICALS) ×1 IMPLANT
POUCH RETRIEVAL ECOSAC 10MM (ENDOMECHANICALS) ×2
SCISSORS LAP 5X35 DISP (ENDOMECHANICALS) ×2 IMPLANT
SET CHOLANGIOGRAPH 5 50 .035 (SET/KITS/TRAYS/PACK) ×2 IMPLANT
SET IRRIG TUBING LAPAROSCOPIC (IRRIGATION / IRRIGATOR) ×2 IMPLANT
SET TUBE SMOKE EVAC HIGH FLOW (TUBING) ×2 IMPLANT
SLEEVE ENDOPATH XCEL 5M (ENDOMECHANICALS) ×4 IMPLANT
SPECIMEN JAR SMALL (MISCELLANEOUS) ×2 IMPLANT
SUT VIC AB 4-0 PS2 27 (SUTURE) ×2 IMPLANT
TOWEL GREEN STERILE (TOWEL DISPOSABLE) ×2 IMPLANT
TOWEL GREEN STERILE FF (TOWEL DISPOSABLE) ×2 IMPLANT
TRAY LAPAROSCOPIC MC (CUSTOM PROCEDURE TRAY) ×2 IMPLANT
TROCAR XCEL BLUNT TIP 100MML (ENDOMECHANICALS) ×2 IMPLANT
TROCAR XCEL NON-BLD 5MMX100MML (ENDOMECHANICALS) ×2 IMPLANT
WATER STERILE IRR 1000ML POUR (IV SOLUTION) ×2 IMPLANT

## 2020-12-30 NOTE — ED Triage Notes (Signed)
Pt states N/V and complaining of right upper quadrant pain. Symptoms started at 01:00 this morning. Took Gaviscon and Omeprazole before arriving to Ed with nor relief.

## 2020-12-30 NOTE — H&P (Signed)
[facility] Surgery Admission Note  Anna Evans 1976/04/23  [id number].    Requesting MD: Rogene Houston, MD Chief Complaint/Reason for Consult: cholecystitis   HPI:  Anna Evans is a 45 y/o F with a PMH GERD, Bells Palsy, and TAH who presented to the ED from med center [place] with a cc RUQ pain. Pt reports severe, acute RUQ pain that woke her up from her sleep around 0100. Pain radiated around her right side. Was not releived with antacids, heat paid, or tylenol. Associated sxs include nausea and vomiting. Denies new foods or sick contacts. States she had chicken fettuccini for dinner. Does report similar pain, twice, in the past that was much less severe and only lasted for one hour. Patient denies tobacco use. Reports occasional alcohol use, not daily. Denies other drug use. Denies use of blood thinning medications. 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 She lives at home with her husband and her son.  ROS: Review of Systems  Constitutional: Negative.   HENT: Negative.    Eyes: Negative.   Respiratory: Negative.    Cardiovascular: Negative.   Gastrointestinal:  Positive for abdominal pain, nausea and vomiting. Negative for blood in stool, constipation, diarrhea, heartburn and melena.  Genitourinary: Negative.   Musculoskeletal: Negative.   Skin: Negative.   Neurological: Negative.   Endo/Heme/Allergies: Negative.   Psychiatric/Behavioral: Negative.     Family History  Problem Relation Age of Onset   Heart disease Father    Heart disease Paternal Grandmother     Past Medical History:  Diagnosis Date   Bell's palsy    Fibroid     Past Surgical History:  Procedure Laterality Date   ABDOMINAL HYSTERECTOMY     TVH    Social History:  reports that she has never smoked. She has never used smokeless tobacco. She reports current alcohol use. She reports that she does not use drugs.  Allergies:  Allergies  Allergen Reactions   Penicillins Rash     (Not in a hospital admission)   Blood pressure (!) 123/43, pulse (!) 56, temperature 97.9 F (36.6 C), temperature source Oral, resp. rate 17, height 5\' 7"  (1.702 m), weight 96.3 kg, SpO2 100 %. Physical Exam: Constitutional: NAD; conversant; no deformities Eyes: Moist conjunctiva; no lid lag; anicteric; PERRL Neck: Trachea midline; no thyromegaly Lungs: Normal respiratory effort; no tactile fremitus CV: RRR; no palpable thrills; no pitting edema GI: Abd soft, TTP epigastrium and RUQ without guarding or peritonitis, negative murphy's; no palpable hepatosplenomegaly MSK: Normal gait; no clubbing/cyanosis Psychiatric: Appropriate affect; alert and oriented x3 Lymphatic: No palpable cervical or axillary lymphadenopathy  Results for orders placed or performed during the hospital encounter of 12/30/20 (from the past 48 hour(s))  Pregnancy, urine     Status: None   Collection Time: 12/30/20  3:16 AM  Result Value Ref Range   Preg Test, Ur NEGATIVE NEGATIVE    Comment:        THE SENSITIVITY OF THIS METHODOLOGY IS >20 mIU/mL. Performed at [facility], [address], [place], [place] [address]   Urinalysis, Routine w reflex microscopic Urine, Clean Catch     Status: Abnormal   Collection Time: 12/30/20  3:16 AM  Result Value Ref Range   Color, Urine YELLOW YELLOW   APPearance CLEAR CLEAR   Specific Gravity, Urine 1.023 1.005 - 1.030   pH 5.5 5.0 - 8.0   Glucose, UA NEGATIVE NEGATIVE mg/dL   Hgb urine dipstick TRACE (A) NEGATIVE  Bilirubin Urine NEGATIVE NEGATIVE   Ketones, ur NEGATIVE NEGATIVE mg/dL   Protein, ur NEGATIVE NEGATIVE mg/dL   Nitrite NEGATIVE NEGATIVE   Leukocytes,Ua NEGATIVE NEGATIVE   RBC / HPF 0-5 0 - 5 RBC/hpf   WBC, UA 0-5 0 - 5 WBC/hpf   Mucus PRESENT     Comment: Performed at [facility], [place], [place] [address]  CBC with Differential     Status: Abnormal   Collection Time: 12/30/20  3:24 AM   Result Value Ref Range   WBC 10.9 (H) 4.0 - 10.5 K/uL   RBC 4.68 3.87 - 5.11 MIL/uL   Hemoglobin 14.0 12.0 - 15.0 g/dL   HCT 41.6 36.0 - 46.0 %   MCV 88.9 80.0 - 100.0 fL   MCH 29.9 26.0 - 34.0 pg   MCHC 33.7 30.0 - 36.0 g/dL   RDW 13.1 11.5 - 15.5 %   Platelets 348 150 - 400 K/uL   nRBC 0.0 0.0 - 0.2 %   Neutrophils Relative % 56 %   Neutro Abs 6.1 1.7 - 7.7 K/uL   Lymphocytes Relative 31 %   Lymphs Abs 3.4 0.7 - 4.0 K/uL   Monocytes Relative 7 %   Monocytes Absolute 0.7 0.1 - 1.0 K/uL   Eosinophils Relative 5 %   Eosinophils Absolute 0.6 (H) 0.0 - 0.5 K/uL   Basophils Relative 1 %   Basophils Absolute 0.1 0.0 - 0.1 K/uL   Immature Granulocytes 0 %   Abs Immature Granulocytes 0.02 0.00 - 0.07 K/uL    Comment: Performed at [facility], [place], [place] [address]  Comprehensive metabolic panel     Status: Abnormal   Collection Time: 12/30/20  3:24 AM  Result Value Ref Range   Sodium 138 135 - 145 mmol/L   Potassium 3.8 3.5 - 5.1 mmol/L   Chloride 102 98 - 111 mmol/L   CO2 25 22 - 32 mmol/L   Glucose, Bld 118 (H) 70 - 99 mg/dL    Comment: Glucose reference range applies only to samples taken after fasting for at least 8 hours.   BUN 13 6 - 20 mg/dL   Creatinine, Ser 0.70 0.44 - 1.00 mg/dL   Calcium 9.3 8.9 - 10.3 mg/dL   Total Protein 7.5 6.5 - 8.1 g/dL   Albumin 4.6 3.5 - 5.0 g/dL   AST 17 15 - 41 U/L   ALT 10 0 - 44 U/L   Alkaline Phosphatase 60 38 - 126 U/L   Total Bilirubin 0.4 0.3 - 1.2 mg/dL   GFR, Estimated >60 >60 mL/min    Comment: (NOTE) Calculated using the CKD-EPI Creatinine Equation (2021)    Anion gap 11 5 - 15    Comment: Performed at [facility], [address] [place], [place] [address]  Lipase, blood     Status: None   Collection Time: 12/30/20  3:24 AM  Result Value Ref Range   Lipase 11 11 - 51 U/L    Comment: Performed at [facility], [address],  [place], [place] [address]  Resp Panel by RT-PCR (Flu A&B, Covid) Nasopharyngeal Swab     Status: None   Collection Time: 12/30/20  5:57 AM   Specimen: Nasopharyngeal Swab; Nasopharyngeal(NP) swabs in vial transport medium  Result Value Ref Range   SARS Coronavirus 2 by RT PCR NEGATIVE NEGATIVE    Comment: (NOTE) SARS-CoV-2 target nucleic acids are NOT DETECTED.  The SARS-CoV-2 RNA is  generally detectable in upper respiratory specimens during the acute phase of infection. The lowest concentration of SARS-CoV-2 viral copies this assay can detect is 138 copies/mL. A negative result does not preclude SARS-Cov-2 infection and should not be used as the sole basis for treatment or other patient management decisions. A negative result may occur with  improper specimen collection/handling, submission of specimen other than nasopharyngeal swab, presence of viral mutation(s) within the areas targeted by this assay, and inadequate number of viral copies(<138 copies/mL). A negative result must be combined with clinical observations, patient history, and epidemiological information. The expected result is Negative.  Fact Sheet for Patients:  EntrepreneurPulse.com.au  Fact Sheet for Healthcare Providers:  IncredibleEmployment.be  This test is no t yet approved or cleared by the [place] FDA and  has been authorized for detection and/or diagnosis of SARS-CoV-2 by FDA under an Emergency Use Authorization (EUA). This EUA will remain  in effect (meaning this test can be used) for the duration of the COVID-19 declaration under Section 564(b)(1) of the Act, 21 U.S.C.section 360bbb-3(b)(1), unless the authorization is terminated  or revoked sooner.       Influenza A by PCR NEGATIVE NEGATIVE   Influenza B by PCR NEGATIVE NEGATIVE    Comment: (NOTE) The Xpert Xpress SARS-CoV-2/FLU/RSV plus assay is intended as an aid in the diagnosis of influenza from  Nasopharyngeal swab specimens and should not be used as a sole basis for treatment. Nasal washings and aspirates are unacceptable for Xpert Xpress SARS-CoV-2/FLU/RSV testing.  Fact Sheet for Patients: EntrepreneurPulse.com.au  Fact Sheet for Healthcare Providers: IncredibleEmployment.be  This test is not yet approved or cleared by the [place] FDA and has been authorized for detection and/or diagnosis of SARS-CoV-2 by FDA under an Emergency Use Authorization (EUA). This EUA will remain in effect (meaning this test can be used) for the duration of the COVID-19 declaration under Section 564(b)(1) of the Act, 21 U.S.C. section 360bbb-3(b)(1), unless the authorization is terminated or revoked.  Performed at [facility], [address] [place], [place] [address]    CT ABDOMEN PELVIS W CONTRAST  Result Date: 12/30/2020 CLINICAL DATA:  Abdominal pain, nonlocalized right upper quadrant pain, vomiting, concern for biliary process. History of endoscopic gastric sleeve, hysterectomy. EXAM: CT ABDOMEN AND PELVIS WITH CONTRAST TECHNIQUE: Multidetector CT imaging of the abdomen and pelvis was performed using the standard protocol following bolus administration of intravenous contrast. CONTRAST:  184mL OMNIPAQUE IOHEXOL 300 MG/ML  SOLN COMPARISON:  CT 11/06/2005, radiograph 10/25/2015 FINDINGS: Lower chest: Atelectatic changes in the otherwise clear lung bases. Normal heart size. No pericardial effusion. Hepatobiliary: No worrisome focal liver lesions. Smooth liver surface contour. Normal hepatic attenuation. Mild gallbladder wall thickening, particularly given the degree of distention. No visible calcified gallstones or significant biliary ductal dilatation. Pancreas: No pancreatic ductal dilatation or surrounding inflammatory changes. Spleen: Normal in size. No concerning splenic lesions. Adrenals/Urinary Tract: Normal adrenals. Kidneys are  normally located with symmetric enhancement and excretion. No suspicious renal lesion, urolithiasis or hydronephrosis. Urinary bladder is largely decompressed at the time of exam and therefore poorly evaluated by CT imaging. No gross bladder abnormality accounting for degree of underdistention. Stomach/Bowel: Distal esophagus is unremarkable. Postsurgical changes of the stomach compatible with reported history of prior endoscopic sleeve gastrectomy. Stomach is otherwise unremarkable. Duodenum is free of acute abnormality with normal sweep across the midline abdomen. No small bowel thickening or dilatation. Appendix is not visualized. No focal inflammation the vicinity of the cecum to suggest an occult  appendicitis. No colonic dilatation or wall thickening. Vascular/Lymphatic: No significant vascular findings are present. No enlarged abdominal or pelvic lymph nodes. Reproductive: Uterus is surgically absent. No concerning adnexal lesions. Other: No abdominopelvic free fluid or free gas. No bowel containing hernias. Musculoskeletal: No acute osseous abnormality or suspicious osseous lesion. IMPRESSION: Mild gallbladder wall thickening. Could reflect early or developing acute cholecystitis. Consider further evaluation with dedicated right upper quadrant ultrasound. No visible calcified gallstones or biliary ductal dilatation. Postsurgical changes of prior endoscopic sleeve gastrectomy, no acute complication. Prior hysterectomy. Electronically Signed   By: Lovena Le M.D.   On: 12/30/2020 04:39   US Abdomen Limited  Result Date: 12/30/2020 CLINICAL DATA:  Right upper quadrant pain, abnormal CT EXAM: ULTRASOUND ABDOMEN LIMITED RIGHT UPPER QUADRANT COMPARISON:  CT 12/30/2020, 11/06/2005 FINDINGS: Gallbladder: Multiple layering gallstones are present within the gallbladder lumen better seen on comparison CT imaging. Largest measuring up to 6 mm in size. There are areas of non uniform gallbladder wall thickening  measuring up to 6 mm in maximal thickness. No pericholecystic fluid. Sonographic Percell Miller sign is reportedly negative. Common bile duct: Diameter: 3.8 mm, nondilated Liver: No focal lesion identified. Within normal limits in parenchymal echogenicity. Portal vein is patent on color Doppler imaging with normal direction of blood flow towards the liver. Other: None. IMPRESSION: Cholelithiasis with areas of non uniform gallbladder wall thickening. Sonographic Percell Miller sign is reportedly negative. Findings are equivocal for an acute cholecystitis though could reflect developing inflammation in the appropriate clinical setting. If there is persisting clinical ambiguity, HIDA could be obtained. No biliary ductal dilatation or other right upper quadrant abnormality. Electronically Signed   By: Lovena Le M.D.   On: 12/30/2020 05:37    Assessment/Plan Bells Palsy GERD - omeprazole at home  Acute calculous cholecystitis  - afebrile, WBC 10.9, RUQ U/S with stones and wall thickening  - history, exam, and above labs/imaging are consistent with cholecystitis, recommend laparoscopic cholecystectomy today by Dr. Georganna Skeans - received rocephin at 0600, PO tylenol on call to OR. - plan for discharge from the PACU later today   - The operative and non-operative management of cholecystitis was discussed with the patient. The risks of surgery including bleeding, infection, damage to surrounding structures, conversion to open, need for additional procedures/surgeries, and the risks of general anesthesia including CVA, MI, respiratory complications were discussed with the patient and she would like to proceed with laparoscopic cholecystectomy.    Jill Alexanders, PA-C [facility] Please see Amion for pager number during day hours 7:00am-4:30pm 12/30/2020, 7:46 AM

## 2020-12-30 NOTE — Anesthesia Preprocedure Evaluation (Addendum)
Anesthesia Evaluation  Patient identified by MRN, date of birth, ID band Patient awake    Reviewed: Allergy & Precautions, NPO status , Patient's Chart, lab work & pertinent test results  Airway Mallampati: II  TM Distance: >3 FB Neck ROM: Full    Dental no notable dental hx. (+) Teeth Intact, Dental Advisory Given   Pulmonary neg pulmonary ROS,    Pulmonary exam normal breath sounds clear to auscultation       Cardiovascular Exercise Tolerance: Good Normal cardiovascular exam Rhythm:Regular Rate:Normal     Neuro/Psych negative neurological ROS  negative psych ROS   GI/Hepatic Neg liver ROS, GERD  ,  Endo/Other  negative endocrine ROS  Renal/GU negative Renal ROS     Musculoskeletal negative musculoskeletal ROS (+)   Abdominal (+) + obese,   Peds  Hematology Lab Results      Component                Value               Date                      WBC                      10.9 (H)            12/30/2020                HGB                      14.0                12/30/2020                HCT                      41.6                12/30/2020                MCV                      88.9                12/30/2020                PLT                      348                 12/30/2020              Anesthesia Other Findings All: PCN  Reproductive/Obstetrics                            Anesthesia Physical Anesthesia Plan  ASA: 2  Anesthesia Plan: General   Post-op Pain Management:    Induction: Intravenous  PONV Risk Score and Plan: Treatment may vary due to age or medical condition, Ondansetron, Midazolam and Dexamethasone  Airway Management Planned: Oral ETT  Additional Equipment: None  Intra-op Plan:   Post-operative Plan: Extubation in OR  Informed Consent: I have reviewed the patients History and Physical, chart, labs and discussed the procedure including the risks, benefits and  alternatives for the proposed anesthesia with the patient or authorized representative who has indicated his/her  understanding and acceptance.     Dental advisory given  Plan Discussed with:   Anesthesia Plan Comments: (GA)       Anesthesia Quick Evaluation

## 2020-12-30 NOTE — ED Notes (Signed)
Patient has left the floor going to [facility] ED

## 2020-12-30 NOTE — ED Notes (Signed)
Consent signed.

## 2020-12-30 NOTE — ED Provider Notes (Addendum)
Patient arrived from [facility] emergency department.  For concerns for acute cholecystitis.  Labs reviewed ultrasound and CT abdomen reviewed.  Patient with a leukocytosis.  Liver function test otherwise normal.  Ultrasound suggestive of perhaps early acute cholecystitis.  Dr. Garnette Czech who was on-call overnight for general surgery was aware of the patient coming.  Patient being seen by general surgery physician assistant.   Fredia Sorrow, MD 12/30/20 [place], [place], MD 12/30/20 [phone]

## 2020-12-30 NOTE — ED Notes (Signed)
Valuables envelope and purse to security and placed in locker.

## 2020-12-30 NOTE — ED Provider Notes (Signed)
[facility] EMERGENCY DEPT Provider Note   CSN: [id number] Arrival date & time: 12/30/20  0305     History Chief Complaint  Patient presents with   Abdominal Pain    Anna Evans is a 45 y.o. female.  Who presented to the emergency room with concern for abdominal pain.  Patient reports that she woke up from sleeping with severe right upper quadrant abdominal pain.  States pain is severe in severity, sharp and aching.  Relatively constant.  Did not have any sort of pain or nausea yesterday.  Has had 3 episodes of vomiting.  Nonbloody nonbilious.  Tried taking omeprazole prior to arrival with no relief.  Denies prior history of gallbladder issues.  HPI     Past Medical History:  Diagnosis Date   Bell's palsy    Fibroid     Patient Active Problem List   Diagnosis Date Noted   Left lower quadrant pain 08/24/2016    Past Surgical History:  Procedure Laterality Date   ABDOMINAL HYSTERECTOMY     TVH     OB History     Gravida  1   Para  1   Term      Preterm      AB      Living  1      SAB      IAB      Ectopic      Multiple      Live Births              Family History  Problem Relation Age of Onset   Heart disease Father    Heart disease Paternal Grandmother     Social History   Tobacco Use   Smoking status: Never   Smokeless tobacco: Never  Vaping Use   Vaping Use: Never used  Substance Use Topics   Alcohol use: Yes    Alcohol/week: 0.0 standard drinks    Comment: social   Drug use: Never    Home Medications Prior to Admission medications   Medication Sig Start Date End Date Taking? Authorizing Provider  ADDERALL XR 30 MG 24 hr capsule Take 1 tablet by mouth daily. 07/30/16   [provider]  ketorolac (TORADOL) 10 MG tablet Take 1 tablet (10 mg total) by mouth every 6 (six) hours as needed. 08/24/16   Terrance Mass, MD    Allergies    Penicillins  Review of Systems   Review of Systems   Constitutional:  Negative for chills and fever.  HENT:  Negative for ear pain and sore throat.   Eyes:  Negative for pain and visual disturbance.  Respiratory:  Negative for cough and shortness of breath.   Cardiovascular:  Negative for chest pain and palpitations.  Gastrointestinal:  Positive for abdominal pain. Negative for vomiting.  Genitourinary:  Negative for dysuria and hematuria.  Musculoskeletal:  Negative for arthralgias and back pain.  Skin:  Negative for color change and rash.  Neurological:  Negative for seizures and syncope.  All other systems reviewed and are negative.  Physical Exam Updated Vital Signs BP 124/63   Pulse 65   Temp 98 F (36.7 C) (Oral)   Resp 16   Ht 5\' 7"  (1.702 m)   Wt 96.3 kg   SpO2 98%   BMI 33.25 kg/m   Physical Exam Vitals and nursing note reviewed.  Constitutional:      General: She is not in acute distress.    Appearance: She is  well-developed.  HENT:     Head: Normocephalic and atraumatic.  Eyes:     Conjunctiva/sclera: Conjunctivae normal.  Cardiovascular:     Rate and Rhythm: Normal rate and regular rhythm.     Heart sounds: No murmur heard. Pulmonary:     Effort: Pulmonary effort is normal. No respiratory distress.     Breath sounds: Normal breath sounds.  Abdominal:     Palpations: Abdomen is soft.     Tenderness: There is abdominal tenderness. There is no guarding or rebound.     Comments: Right upper quadrant tenderness noted, no rebound or guarding  Musculoskeletal:     Cervical back: Neck supple.  Skin:    General: Skin is warm and dry.  Neurological:     Mental Status: She is alert.    ED Results / Procedures / Treatments   Labs (all labs ordered are listed, but only abnormal results are displayed) Labs Reviewed  CBC WITH DIFFERENTIAL/PLATELET - Abnormal; Notable for the following components:      Result Value   WBC 10.9 (*)    Eosinophils Absolute 0.6 (*)    All other components within normal limits   COMPREHENSIVE METABOLIC PANEL - Abnormal; Notable for the following components:   Glucose, Bld 118 (*)    All other components within normal limits  URINALYSIS, ROUTINE W REFLEX MICROSCOPIC - Abnormal; Notable for the following components:   Hgb urine dipstick TRACE (*)    All other components within normal limits  RESP PANEL BY RT-PCR (FLU A&B, COVID) ARPGX2  LIPASE, BLOOD  PREGNANCY, URINE    EKG None  Radiology CT ABDOMEN PELVIS W CONTRAST  Result Date: 12/30/2020 CLINICAL DATA:  Abdominal pain, nonlocalized right upper quadrant pain, vomiting, concern for biliary process. History of endoscopic gastric sleeve, hysterectomy. EXAM: CT ABDOMEN AND PELVIS WITH CONTRAST TECHNIQUE: Multidetector CT imaging of the abdomen and pelvis was performed using the standard protocol following bolus administration of intravenous contrast. CONTRAST:  158mL OMNIPAQUE IOHEXOL 300 MG/ML  SOLN COMPARISON:  CT 11/06/2005, radiograph 10/25/2015 FINDINGS: Lower chest: Atelectatic changes in the otherwise clear lung bases. Normal heart size. No pericardial effusion. Hepatobiliary: No worrisome focal liver lesions. Smooth liver surface contour. Normal hepatic attenuation. Mild gallbladder wall thickening, particularly given the degree of distention. No visible calcified gallstones or significant biliary ductal dilatation. Pancreas: No pancreatic ductal dilatation or surrounding inflammatory changes. Spleen: Normal in size. No concerning splenic lesions. Adrenals/Urinary Tract: Normal adrenals. Kidneys are normally located with symmetric enhancement and excretion. No suspicious renal lesion, urolithiasis or hydronephrosis. Urinary bladder is largely decompressed at the time of exam and therefore poorly evaluated by CT imaging. No gross bladder abnormality accounting for degree of underdistention. Stomach/Bowel: Distal esophagus is unremarkable. Postsurgical changes of the stomach compatible with reported history of prior  endoscopic sleeve gastrectomy. Stomach is otherwise unremarkable. Duodenum is free of acute abnormality with normal sweep across the midline abdomen. No small bowel thickening or dilatation. Appendix is not visualized. No focal inflammation the vicinity of the cecum to suggest an occult appendicitis. No colonic dilatation or wall thickening. Vascular/Lymphatic: No significant vascular findings are present. No enlarged abdominal or pelvic lymph nodes. Reproductive: Uterus is surgically absent. No concerning adnexal lesions. Other: No abdominopelvic free fluid or free gas. No bowel containing hernias. Musculoskeletal: No acute osseous abnormality or suspicious osseous lesion. IMPRESSION: Mild gallbladder wall thickening. Could reflect early or developing acute cholecystitis. Consider further evaluation with dedicated right upper quadrant ultrasound. No visible calcified gallstones or biliary  ductal dilatation. Postsurgical changes of prior endoscopic sleeve gastrectomy, no acute complication. Prior hysterectomy. Electronically Signed   By: Lovena Le M.D.   On: 12/30/2020 04:39   US Abdomen Limited  Result Date: 12/30/2020 CLINICAL DATA:  Right upper quadrant pain, abnormal CT EXAM: ULTRASOUND ABDOMEN LIMITED RIGHT UPPER QUADRANT COMPARISON:  CT 12/30/2020, 11/06/2005 FINDINGS: Gallbladder: Multiple layering gallstones are present within the gallbladder lumen better seen on comparison CT imaging. Largest measuring up to 6 mm in size. There are areas of non uniform gallbladder wall thickening measuring up to 6 mm in maximal thickness. No pericholecystic fluid. Sonographic Percell Miller sign is reportedly negative. Common bile duct: Diameter: 3.8 mm, nondilated Liver: No focal lesion identified. Within normal limits in parenchymal echogenicity. Portal vein is patent on color Doppler imaging with normal direction of blood flow towards the liver. Other: None. IMPRESSION: Cholelithiasis with areas of non uniform gallbladder  wall thickening. Sonographic Percell Miller sign is reportedly negative. Findings are equivocal for an acute cholecystitis though could reflect developing inflammation in the appropriate clinical setting. If there is persisting clinical ambiguity, HIDA could be obtained. No biliary ductal dilatation or other right upper quadrant abnormality. Electronically Signed   By: Lovena Le M.D.   On: 12/30/2020 05:37    Procedures Procedures   Medications Ordered in ED Medications  cefTRIAXone (ROCEPHIN) 2 g in sodium chloride 0.9 % 100 mL IVPB (2 g Intravenous New Bag/Given 12/30/20 0604)  ondansetron (ZOFRAN) injection 4 mg (4 mg Intravenous Given 12/30/20 0338)  fentaNYL (SUBLIMAZE) injection 50 mcg (50 mcg Intravenous Given 12/30/20 0338)  iohexol (OMNIPAQUE) 300 MG/ML solution 100 mL (100 mLs Intravenous Contrast Given 12/30/20 0423)    ED Course  I have reviewed the triage vital signs and the nursing notes.  Pertinent labs & imaging results that were available during my care of the patient were reviewed by me and considered in my medical decision making (see chart for details).    MDM Rules/Calculators/A&P                          45 year old lady presents to ER with concern for right upper quadrant pain.  On exam she is well-appearing in no distress, noted to have some tenderness in her right upper quadrant.  Both CT and ultrasound concerning for possible early cholecystitis.  Discussed case with Dr. Donne Hazel who recommends starting antibiotics and transfer to [facility] for evaluation by general surgery, likely cholecystectomy.  Discussed case with Dr. Betsey Holiday who will accept patient ER to ER.  Discussed with patient, agreeable.  Discussed risk and benefits of transfer via [facility] versus POV.  Patient strongly desires to go POV at present.  Given stable vital signs and overall well appearance throughout her ER stay, believe this is reasonable option.  Family member coming to transport.    Final  Clinical Impression(s) / ED Diagnoses Final diagnoses:  Acute cholecystitis  Leukocytosis, unspecified type    Rx / DC Orders ED Discharge Orders     None        Lucrezia Starch, MD 12/30/20 [phone]

## 2020-12-30 NOTE — ED Notes (Signed)
ED Provider at bedside. 

## 2020-12-30 NOTE — Discharge Instructions (Signed)
[facility]  Please arrive at least 30 min before your appointment to complete your check in paperwork.  If you are unable to arrive 30 min prior to your appointment time we may have to cancel or reschedule you. LAPAROSCOPIC SURGERY: POST OP INSTRUCTIONS Always review your discharge instruction sheet given to you by the facility where your surgery was performed. IF YOU HAVE DISABILITY OR FAMILY LEAVE FORMS, YOU MUST BRING THEM TO THE OFFICE FOR PROCESSING.   DO NOT GIVE THEM TO YOUR DOCTOR.  PAIN CONTROL  First take acetaminophen (Tylenol) AND/or ibuprofen (Advil) to control your pain after surgery.  Follow directions on package.  Taking acetaminophen (Tylenol) and/or ibuprofen (Advil) regularly after surgery will help to control your pain and lower the amount of prescription pain medication you may need.  You should not take more than 4,000 mg (4 grams) of acetaminophen (Tylenol) in 24 hours.  You should not take ibuprofen (Advil), aleve, motrin, naprosyn or other NSAIDS if you have a history of stomach ulcers or chronic kidney disease.  A prescription for pain medication may be given to you upon discharge.  Take your pain medication as prescribed, if you still have uncontrolled pain after taking acetaminophen (Tylenol) or ibuprofen (Advil). Use ice packs to help control pain. If you need a refill on your pain medication, please contact your pharmacy.  They will contact our office to request authorization. Prescriptions will not be filled after 5pm or on week-ends.  HOME MEDICATIONS Take your usually prescribed medications unless otherwise directed.  DIET You should follow a light diet the first few days after arrival home.  Be sure to include lots of fluids daily. Avoid fatty, fried foods.   CONSTIPATION It is common to experience some constipation after surgery and if you are taking pain medication.  Increasing fluid intake and taking a stool softener (such as Colace)  will usually help or prevent this problem from occurring.  A mild laxative (Milk of Magnesia or Miralax) should be taken according to package instructions if there are no bowel movements after 48 hours.  WOUND/INCISION CARE Most patients will experience some swelling and bruising in the area of the incisions.  Ice packs will help.  Swelling and bruising can take several days to resolve.  Unless discharge instructions indicate otherwise, follow guidelines below  STERI-STRIPS - you may remove your outer bandages 48 hours after surgery, and you may shower at that time.  You have steri-strips (small skin tapes) in place directly over the incision.  These strips should be left on the skin for 7-10 days.   DERMABOND/SKIN GLUE - you may shower in 24 hours.  The glue will flake off over the next 2-3 weeks. Any sutures or staples will be removed at the office during your follow-up visit.  ACTIVITIES You may resume regular (light) daily activities beginning the next day--such as daily self-care, walking, climbing stairs--gradually increasing activities as tolerated.  You may have sexual intercourse when it is comfortable.  Refrain from any heavy lifting or straining until approved by your doctor. You may drive when you are no longer taking prescription pain medication, you can comfortably wear a seatbelt, and you can safely maneuver your car and apply brakes.  FOLLOW-UP You should see your doctor in the office for a follow-up appointment approximately 2-3 weeks after your surgery.  You should have been given your post-op/follow-up appointment when your surgery was scheduled.  If you did not receive a post-op/follow-up appointment, make sure   that you call for this appointment within a day or two after you arrive home to insure a convenient appointment time.  OTHER INSTRUCTIONS  WHEN TO CALL YOUR DOCTOR: Fever over 101.0 Inability to urinate Continued bleeding from incision. Increased pain, redness, or  drainage from the incision. Increasing abdominal pain  The clinic staff is available to answer your questions during regular business hours.  Please don't hesitate to call and ask to speak to one of the nurses for clinical concerns.  If you have a medical emergency, go to the nearest emergency room or call 911.  A surgeon from [facility] is always on call at the hospital. [address], [address], [place], [place]  [address] ? [address] [address], [place], [place]   [address] [phone] ? [phone] ? FAX [phone]   

## 2020-12-30 NOTE — Transfer of Care (Signed)
Immediate Anesthesia Transfer of Care Note  Patient: Anna Evans  Procedure(s) Performed: LAPAROSCOPIC CHOLECYSTECTOMY (Abdomen)  Patient Location: PACU  Anesthesia Type:General  Level of Consciousness: awake, alert  and oriented  Airway & Oxygen Therapy: Patient Spontanous Breathing  Post-op Assessment: Report given to RN and Post -op Vital signs reviewed and stable  Post vital signs: Reviewed and stable  Last Vitals:  Vitals Value Taken Time  BP 134/71 12/30/20 1145  Temp 36.5 C 12/30/20 1145  Pulse 69 12/30/20 1148  Resp 16 12/30/20 1148  SpO2 99 % 12/30/20 1148  Vitals shown include unvalidated device data.  Last Pain:  Vitals:   12/30/20 0910  TempSrc: Oral  PainSc:       Patients Stated Pain Goal: 0 (74/[id number]/49 [id number])  Complications: No notable events documented.

## 2020-12-30 NOTE — Anesthesia Procedure Notes (Signed)
Procedure Name: Intubation Date/Time: 12/30/2020 10:45 AM Performed by: Griffin Dakin, CRNA Pre-anesthesia Checklist: Patient identified, Emergency Drugs available, Suction available and Patient being monitored Patient Re-evaluated:Patient Re-evaluated prior to induction Oxygen Delivery Method: Circle system utilized Preoxygenation: Pre-oxygenation with 100% oxygen Induction Type: IV induction Ventilation: Mask ventilation without difficulty Laryngoscope Size: Mac and 4 Grade View: Grade I Tube type: Oral Tube size: 7.0 mm Number of attempts: 1 Airway Equipment and Method: Stylet and Oral airway Placement Confirmation: ETT inserted through vocal cords under direct vision, positive ETCO2 and breath sounds checked- equal and bilateral Secured at: 21 cm Tube secured with: Tape Dental Injury: Teeth and Oropharynx as per pre-operative assessment

## 2020-12-30 NOTE — Anesthesia Postprocedure Evaluation (Signed)
Anesthesia Post Note  Patient: Anna Evans  Procedure(s) Performed: LAPAROSCOPIC CHOLECYSTECTOMY (Abdomen)     Patient location during evaluation: PACU Anesthesia Type: General Level of consciousness: awake and alert Pain management: pain level controlled Vital Signs Assessment: post-procedure vital signs reviewed and stable Respiratory status: spontaneous breathing, nonlabored ventilation, respiratory function stable and patient connected to nasal cannula oxygen Cardiovascular status: blood pressure returned to baseline and stable Postop Assessment: no apparent nausea or vomiting Anesthetic complications: no   No notable events documented.  Last Vitals:  Vitals:   12/30/20 1200 12/30/20 1215  BP: 130/68 124/74  Pulse: 63 60  Resp: 12 16  Temp:  36.5 C  SpO2: 100% 99%    Last Pain:  Vitals:   12/30/20 1215  TempSrc:   PainSc: 0-No pain                 Barnet Glasgow

## 2020-12-30 NOTE — ED Notes (Signed)
Surgeon at bedside.  

## 2020-12-30 NOTE — Op Note (Signed)
  12/30/2020  11:31 AM  PATIENT:  Anna Evans  45 y.o. female  PRE-OPERATIVE DIAGNOSIS:  Cholecystits  POST-OPERATIVE DIAGNOSIS:  Cholecystits  PROCEDURE:  Procedure(s): LAPAROSCOPIC CHOLECYSTECTOMY  SURGEON:  Surgeon(s): Georganna Skeans, MD  ASSISTANTS: none   ANESTHESIA:   local and general  EBL:  No intake/output data recorded.  BLOOD ADMINISTERED:none  DRAINS: none   SPECIMEN:  Excision  DISPOSITION OF SPECIMEN:  PATHOLOGY  COUNTS:  YES  DICTATION: .Dragon Dictation Procedure in detail: Informed consent was obtained.  She received intravenous antibiotics.  She was brought to the operating room and general endotracheal anesthesia was administered by the anesthesia staff.  Her abdomen was prepped and draped in a sterile fashion.  A timeout procedure was performed.The infraumbilical region was infiltrated with local. Infraumbilical incision was made. Subcutaneous tissues were dissected down revealing the anterior fascia. This was divided sharply along the midline. Peritoneal cavity was entered under direct vision without complication. A 0 Vicryl pursestring was placed around the fascial opening. Hassan trocar was inserted into the abdomen. The abdomen was insufflated with carbon dioxide in standard fashion. Under direct vision a 5 mm epigastric and 5 mm right abdominal port x2 were placed.  Local was used at each port site.  Laparoscopic exploration revealed a very inflamed gallbladder.  The dome was retracted superior medially.  The infundibulum was retracted inferior laterally.  Dissection began laterally and first identified the cystic artery.  This was clipped twice proximally and once distally and divided.  The cystic duct was then dissected until we had a clear critical view.  3 clips were placed proximally and one was placed distally and it was divided.  The gallbladder was taken off the liver bed using Bovie cautery.  The liver bed was somewhat fragile.  I used  cautery along the way for hemostasis.  The gallbladder was placed in a bag and removed from the abdomen.  I then irrigated and further cauterized the liver bed and once had good hemostasis I placed a piece of Surgicel snow.  The liver bed was nice and dry.  Clips remain in good position.  The irrigation fluid was evacuated.  Four-quadrant inspection revealed no complications.  Ports were removed under direct vision.  Pneumoperitoneum was released.  Infraumbilical fascia was closed by tying the pursestring.  All 4 wounds were irrigated and the skin of each was closed with 4-0 Vicryl followed by Dermabond.  All counts were correct.  She tolerated the procedure well without apparent complication and was taken recovery in stable condition.  PATIENT DISPOSITION:  PACU - hemodynamically stable.   Delay start of Pharmacological VTE agent (>24hrs) due to surgical blood loss or risk of bleeding:  no  Georganna Skeans, MD, MPH, FACS Pager: [phone]  6/13/202211:31 AM

## 2020-12-31 ENCOUNTER — Encounter (HOSPITAL_COMMUNITY): Payer: Self-pay | Admitting: General Surgery

## 2020-12-31 LAB — SURGICAL PATHOLOGY

## 2021-07-28 ENCOUNTER — Encounter (HOSPITAL_COMMUNITY): Payer: Self-pay | Admitting: Emergency Medicine

## 2021-07-28 ENCOUNTER — Emergency Department (HOSPITAL_COMMUNITY): Payer: 59

## 2021-07-28 ENCOUNTER — Emergency Department (HOSPITAL_COMMUNITY)
Admission: EM | Admit: 2021-07-28 | Discharge: 2021-07-28 | Discharge status: Against Medical Advice | Payer: 59 | Attending: Emergency Medicine | Admitting: Emergency Medicine

## 2021-07-28 DIAGNOSIS — R079 Chest pain, unspecified: Secondary | ICD-10-CM | POA: Insufficient documentation

## 2021-07-28 DIAGNOSIS — Z5321 Procedure and treatment not carried out due to patient leaving prior to being seen by health care provider: Secondary | ICD-10-CM | POA: Insufficient documentation

## 2021-07-28 DIAGNOSIS — R112 Nausea with vomiting, unspecified: Secondary | ICD-10-CM | POA: Diagnosis not present

## 2021-07-28 LAB — COMPREHENSIVE METABOLIC PANEL
ALT: 50 U/L — ABNORMAL HIGH (ref 0–44)
AST: 115 U/L — ABNORMAL HIGH (ref 15–41)
Albumin: 3.9 g/dL (ref 3.5–5.0)
Alkaline Phosphatase: 151 U/L — ABNORMAL HIGH (ref 38–126)
Anion gap: 6 (ref 5–15)
BUN: 13 mg/dL (ref 6–20)
CO2: 25 mmol/L (ref 22–32)
Calcium: 8.6 mg/dL — ABNORMAL LOW (ref 8.9–10.3)
Chloride: 105 mmol/L (ref 98–111)
Creatinine, Ser: 0.72 mg/dL (ref 0.44–1.00)
GFR, Estimated: >60 mL/min (ref >60)
Glucose, Bld: 122 mg/dL — ABNORMAL HIGH (ref 70–99)
Potassium: 3.6 mmol/L (ref 3.5–5.1)
Sodium: 136 mmol/L (ref 135–145)
Total Bilirubin: 0.7 mg/dL (ref 0.3–1.2)
Total Protein: 6.5 g/dL (ref 6.5–8.1)

## 2021-07-28 LAB — CBC WITH DIFFERENTIAL/PLATELET
Abs Immature Granulocytes: 0.02 10*3/uL (ref 0.00–0.07)
Basophils Absolute: 0.1 10*3/uL (ref 0.0–0.1)
Basophils Relative: 1 %
Eosinophils Absolute: 0.5 10*3/uL (ref 0.0–0.5)
Eosinophils Relative: 6 %
HCT: 38.6 % (ref 36.0–46.0)
Hemoglobin: 13 g/dL (ref 12.0–15.0)
Immature Granulocytes: 0 %
Lymphocytes Relative: 20 %
Lymphs Abs: 1.6 10*3/uL (ref 0.7–4.0)
MCH: 30.3 pg (ref 26.0–34.0)
MCHC: 33.7 g/dL (ref 30.0–36.0)
MCV: 90 fL (ref 80.0–100.0)
Monocytes Absolute: 0.5 10*3/uL (ref 0.1–1.0)
Monocytes Relative: 6 %
Neutro Abs: 5.5 10*3/uL (ref 1.7–7.7)
Neutrophils Relative %: 67 %
Platelets: 286 10*3/uL (ref 150–400)
RBC: 4.29 MIL/uL (ref 3.87–5.11)
RDW: 13.1 % (ref 11.5–15.5)
WBC: 8.1 10*3/uL (ref 4.0–10.5)
nRBC: 0 % (ref 0.0–0.2)

## 2021-07-28 LAB — LIPASE, BLOOD: Lipase: 31 U/L (ref 11–51)

## 2021-07-28 LAB — I-STAT BETA HCG BLOOD, ED (MC, WL, AP ONLY): I-stat hCG, quantitative: <5 m[IU]/mL (ref <5)

## 2021-07-28 LAB — TROPONIN I (HIGH SENSITIVITY): Troponin I (High Sensitivity): 4 ng/L (ref <18)

## 2021-07-28 MED ORDER — ASPIRIN 81 MG PO CHEW: 324.0000 mg | CHEWABLE_TABLET | Freq: Once | ORAL | Status: DC

## 2021-07-28 MED ORDER — ONDANSETRON 4 MG PO TBDP: 4.0000 mg | ORAL_TABLET | Freq: Once | ORAL | Status: DC

## 2021-07-28 NOTE — ED Provider Triage Note (Signed)
Emergency Medicine Provider Triage Evaluation Note  Rheana Casebolt , a 46 y.o. female  was evaluated in triage.  Pt complains of chest pain.  Patient reports that chest pain started approximately 45 minutes prior.  Pain started as she was getting out of her car and returning to work after having lunch.  Patient reports that pain feels like a "crushing."  Denies any radiation of pain.  Pain has waxed and waned in intensity however has been constant since starting.  Patient endorses diaphoresis, nausea, and vomiting.  Describes emesis as stomach contents.  Patient took antacids and 160 mg of aspirin prior to arrival however vomited soon after taking both of these modalities.  Past medical history of cholecystectomy.  Nausea and  Review of Systems  Positive: Chest pain, diaphoresis, nausea, vomiting Negative: Palpitations, shortness of breath, lightheadedness, syncope, leg swelling or tenderness  Physical Exam  BP (!) 125/56 (BP Location: Left Arm)    Pulse 84    Temp 98.2 F (36.8 C) (Oral)    Resp 16    SpO2 99%  Gen:   Awake, no distress   Resp:  Normal effort, lungs clear to auscultation bilaterally MSK:   Moves extremities without difficulty  Other:  +2 radial pulse bilaterally.  No swelling or tenderness to bilateral lower extremities.  Medical Decision Making  Medically screening exam initiated at 2:30 PM.  Appropriate orders placed.  Tommy Rainwater was informed that the remainder of the evaluation will be completed by another provider, this initial triage assessment does not replace that evaluation, and the importance of remaining in the ED until their evaluation is complete.  ASA ordered, patient given Zofran.   Loni Beckwith, [place] 07/28/21 1432

## 2021-07-28 NOTE — ED Triage Notes (Signed)
Patient complains of sudden onset of chest pain while getting out of her returning to work at approximately 1330 today. Patient states she thought it was indigestion but it did not resolve with antacids. Patient also chewed two 81mg  aspirin.Patient came to ED after vomiting twice at work. Patient states pain alternates between intense squeezing to mild soreness.Patient alert, oriented,and in no apparent distress at this time.

## 2021-07-28 NOTE — ED Notes (Signed)
Patient gave registration labels and states they are leaving

## 2021-08-28 ENCOUNTER — Ambulatory Visit
Admission: RE | Admit: 2021-08-28 | Discharge: 2021-08-28 | Disposition: A | Discharge status: Home / Self Care | Payer: 59 | Source: Ambulatory Visit | Attending: Family Medicine | Admitting: Family Medicine

## 2021-08-28 DIAGNOSIS — Z1231 Encounter for screening mammogram for malignant neoplasm of breast: Secondary | ICD-10-CM

## 2022-07-27 ENCOUNTER — Other Ambulatory Visit: Payer: Self-pay | Admitting: Family Medicine

## 2022-07-27 DIAGNOSIS — Z1231 Encounter for screening mammogram for malignant neoplasm of breast: Secondary | ICD-10-CM

## 2022-09-15 ENCOUNTER — Ambulatory Visit: Payer: 59

## 2022-09-22 ENCOUNTER — Ambulatory Visit
Admission: RE | Admit: 2022-09-22 | Discharge: 2022-09-22 | Disposition: A | Discharge status: Home / Self Care | Payer: 59 | Source: Ambulatory Visit | Attending: Family Medicine | Admitting: Family Medicine

## 2022-09-22 DIAGNOSIS — Z1231 Encounter for screening mammogram for malignant neoplasm of breast: Secondary | ICD-10-CM

## 2023-02-12 ENCOUNTER — Encounter: Payer: Self-pay | Admitting: Podiatry

## 2023-02-12 ENCOUNTER — Ambulatory Visit (INDEPENDENT_AMBULATORY_CARE_PROVIDER_SITE_OTHER): Payer: 59

## 2023-02-12 ENCOUNTER — Ambulatory Visit: Payer: 59 | Admitting: Podiatry

## 2023-02-12 ENCOUNTER — Other Ambulatory Visit: Payer: Self-pay | Admitting: Podiatry

## 2023-02-12 DIAGNOSIS — M722 Plantar fascial fibromatosis: Secondary | ICD-10-CM | POA: Diagnosis not present

## 2023-02-12 DIAGNOSIS — M79671 Pain in right foot: Secondary | ICD-10-CM

## 2023-02-12 MED ORDER — DICLOFENAC SODIUM 75 MG PO TBEC: 75.0000 mg | DELAYED_RELEASE_TABLET | Freq: Two times a day (BID) | ORAL | 0 refills | Status: AC

## 2023-02-12 NOTE — Patient Instructions (Signed)

## 2023-02-12 NOTE — Progress Notes (Signed)
  Subjective:  Patient ID: Anna Evans, female    DOB: Dec 02, 1975,   MRN: [id number]  Chief Complaint  Patient presents with   Foot Pain    Pt states she has been having heel pain about 4-5 months now she says that when she walks her dog by the next day she will be in excruciating pain hurts the most toward the night     47 y.o. female presents for concern of right heel pain that has been going on for 4-5 months. Relates that she has had plantar fasciitis in the past but this time pain in a slightly different spot. Relates  she has been taking anti-inflammatories. She has been starting to stretch and wear supportive shoes wit some relief. Denies any other pedal complaints. Denies n/v/f/c.   Past Medical History:  Diagnosis Date   Bell's palsy    Fibroid     Objective:  Physical Exam: Vascular: DP/PT pulses 2/4 bilateral. CFT <3 seconds. Normal hair growth on digits. No edema.  Skin. No lacerations or abrasions bilateral feet.  Musculoskeletal: MMT 5/5 bilateral lower extremities in DF, PF, Inversion and Eversion. Deceased ROM in DF of ankle joint. Tender to medial calcaneal tubercle on the right. No pain along achilles or PT tendon. No pain in arch. No pain with calcaneal squeeze.  Neurological: Sensation intact to light touch.   Assessment:   1. Plantar fasciitis, right      Plan:  Patient was evaluated and treated and all questions answered. Discussed plantar fasciitis with patient.  X-rays reviewed and discussed with patient. No acute fractures or dislocations noted. Mild spurring noted at inferior calcaneus.  Discussed treatment options including, ice, NSAIDS, supportive shoes, bracing, and stretching. Stretching exercises provided to be done on a daily basis.   Prescription for diclofenac provided and sent to pharmacy. Most recent CMP reviewed and kidney function labs normal.  PF brace dispensed.  Follow-up 6 weeks or sooner if any problems arise. In the meantime,  encouraged to call the office with any questions, concerns, change in symptoms.    Louann Sjogren, DPM

## 2023-03-29 ENCOUNTER — Ambulatory Visit (INDEPENDENT_AMBULATORY_CARE_PROVIDER_SITE_OTHER): Payer: 59 | Admitting: Podiatry

## 2023-03-29 DIAGNOSIS — Z91199 Patient's noncompliance with other medical treatment and regimen due to unspecified reason: Secondary | ICD-10-CM

## 2023-03-29 NOTE — Progress Notes (Signed)
No show

## 2023-07-07 IMAGING — MG MM DIGITAL SCREENING BILAT W/ TOMO AND CAD
8 series · 8 of 24 positions shown · non-contrast
Comparison: Previous exam(s).

CLINICAL DATA: Screening.

EXAM:
DIGITAL SCREENING BILATERAL MAMMOGRAM WITH TOMOSYNTHESIS AND CAD
TECHNIQUE: Bilateral screening digital craniocaudal and mediolateral oblique
mammograms were obtained. Bilateral screening digital breast
tomosynthesis was performed. The images were evaluated with
computer-aided detection.

[L MLO synth-2D]
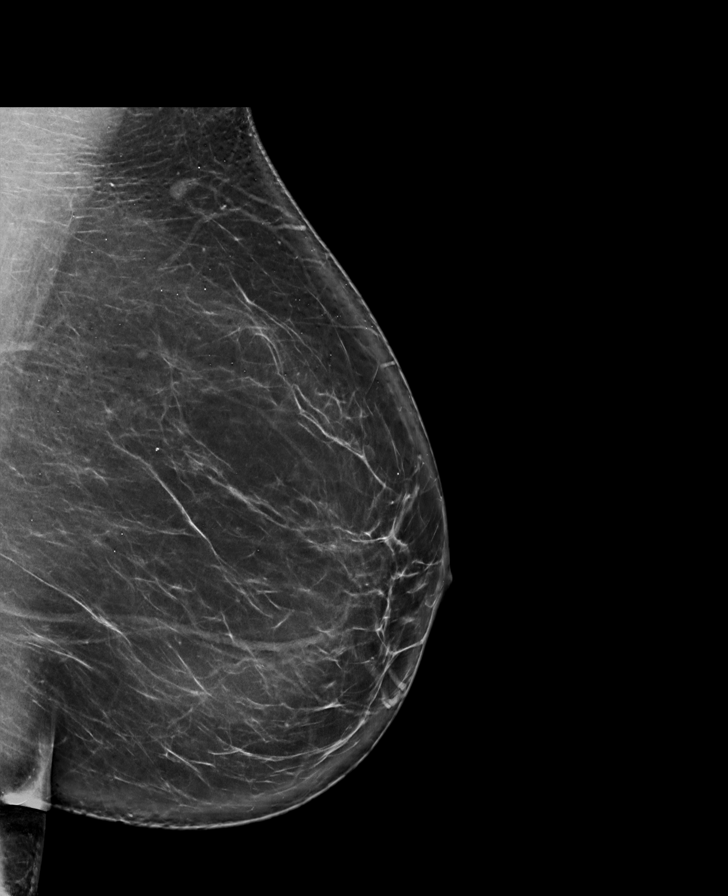

[R MLO synth-2D]
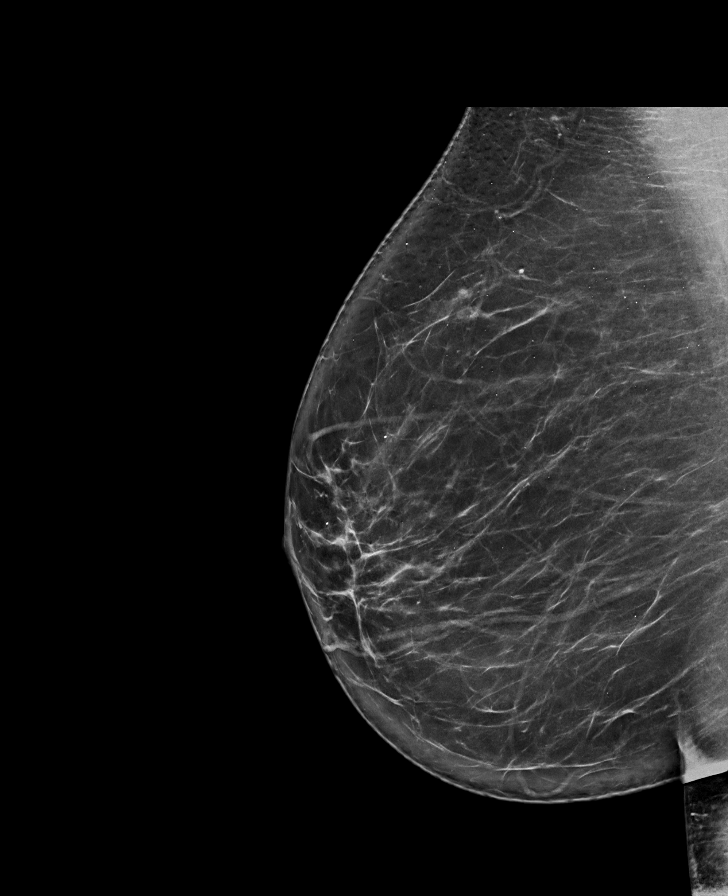

[R CC synth-2D]
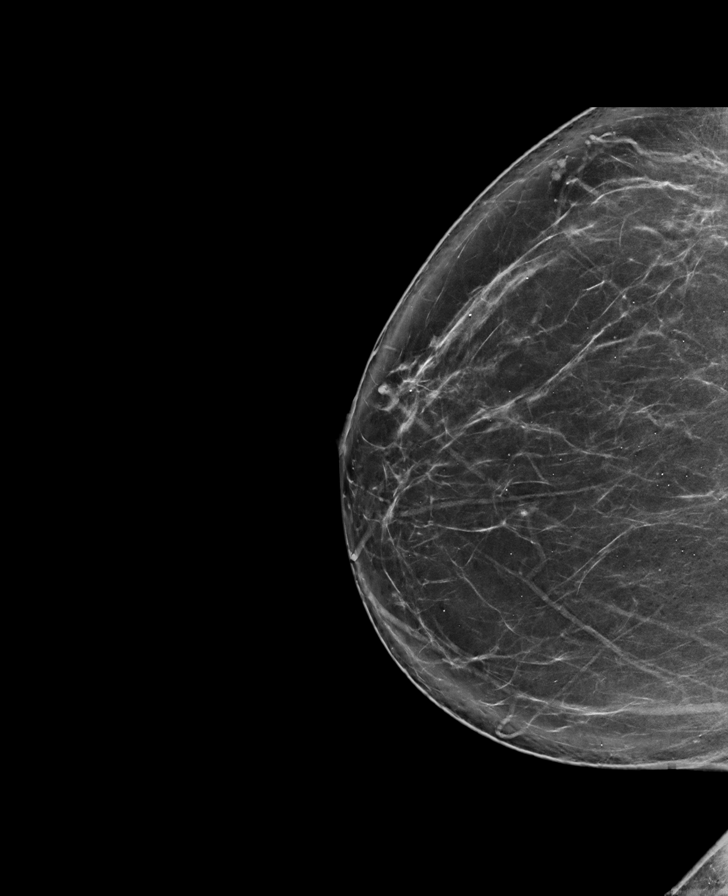

[L CC synth-2D]
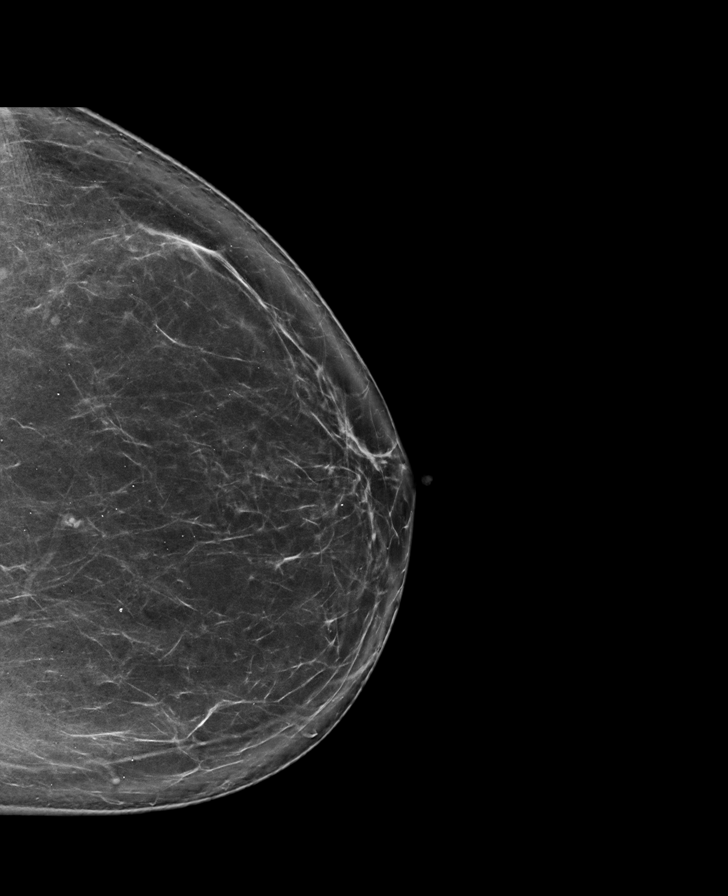

[L MLO tomo · tomo slice 49/96.0]
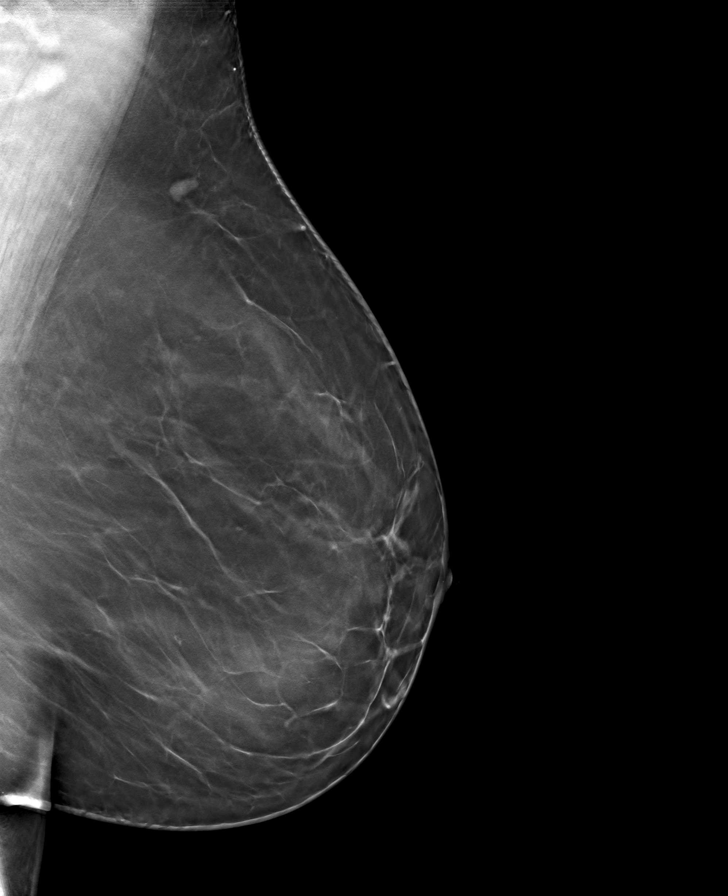

[L CC tomo · tomo slice 45/88.0]
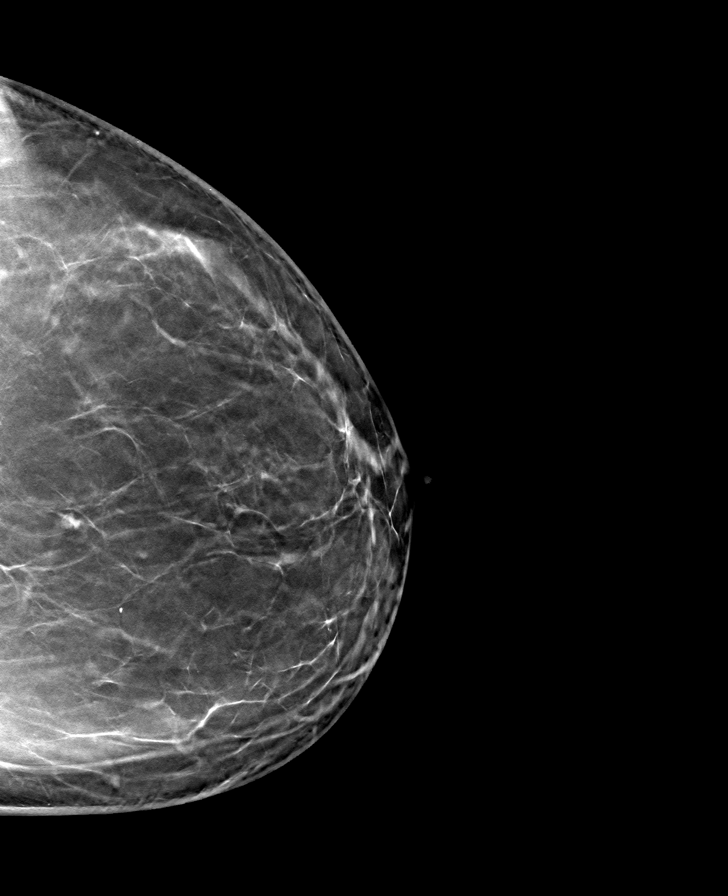

[R CC tomo · tomo slice 43/86.0]
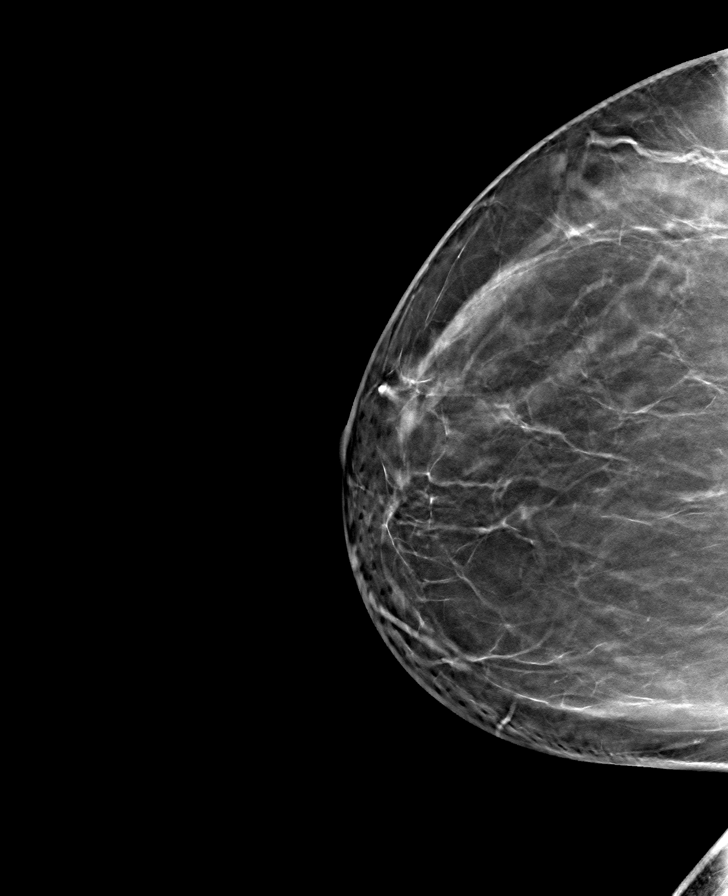

[R MLO tomo · tomo slice 47/93.0]
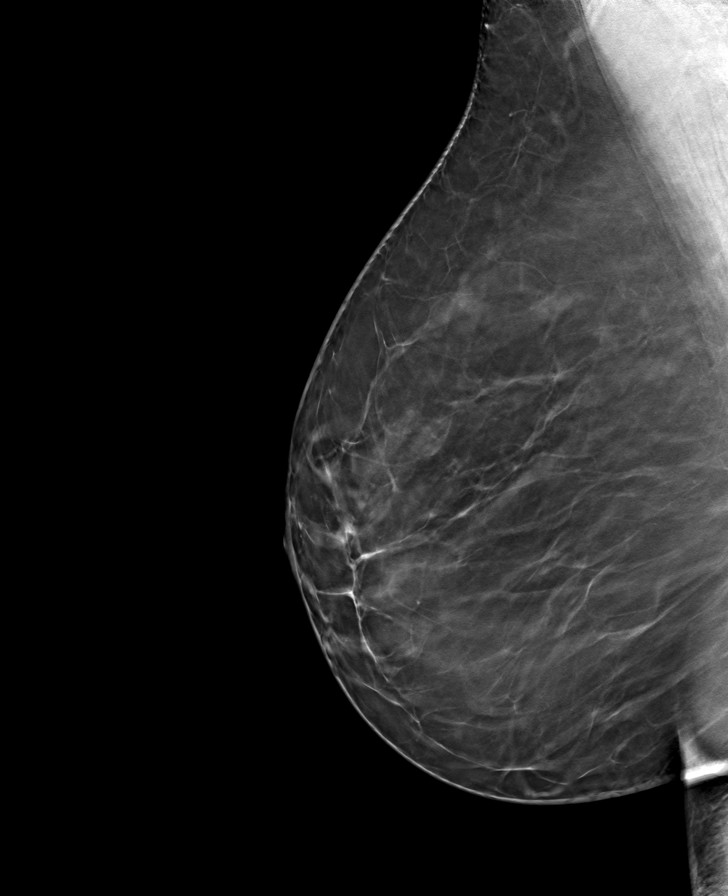

[8 of 24 positions shown; findings below may reference images not displayed]

ACR Breast Density Category b: There are scattered areas of
fibroglandular density.
FINDINGS: There are no findings suspicious for malignancy.
IMPRESSION: No mammographic evidence of malignancy. A result letter of this
screening mammogram will be mailed directly to the patient.

RECOMMENDATION:
Screening mammogram in one year. (Code:51-O-LD2)

BI-RADS CATEGORY  1: Negative.

## 2023-09-25 ENCOUNTER — Other Ambulatory Visit: Payer: Self-pay

## 2023-09-25 ENCOUNTER — Encounter (HOSPITAL_BASED_OUTPATIENT_CLINIC_OR_DEPARTMENT_OTHER): Payer: Self-pay

## 2023-09-25 ENCOUNTER — Emergency Department (HOSPITAL_BASED_OUTPATIENT_CLINIC_OR_DEPARTMENT_OTHER)
Admission: EM | Admit: 2023-09-25 | Discharge: 2023-09-25 | Disposition: A | Discharge status: Home / Self Care | Attending: Emergency Medicine | Admitting: Emergency Medicine

## 2023-09-25 ENCOUNTER — Emergency Department (HOSPITAL_BASED_OUTPATIENT_CLINIC_OR_DEPARTMENT_OTHER): Admitting: Radiology

## 2023-09-25 DIAGNOSIS — R000 Tachycardia, unspecified: Secondary | ICD-10-CM | POA: Diagnosis present

## 2023-09-25 DIAGNOSIS — I4891 Unspecified atrial fibrillation: Secondary | ICD-10-CM | POA: Diagnosis not present

## 2023-09-25 DIAGNOSIS — Z7901 Long term (current) use of anticoagulants: Secondary | ICD-10-CM | POA: Insufficient documentation

## 2023-09-25 LAB — CBC
HCT: 43.3 % (ref 36.0–46.0)
Hemoglobin: 14.9 g/dL (ref 12.0–15.0)
MCH: 30.2 pg (ref 26.0–34.0)
MCHC: 34.4 g/dL (ref 30.0–36.0)
MCV: 87.8 fL (ref 80.0–100.0)
Platelets: 357 10*3/uL (ref 150–400)
RBC: 4.93 MIL/uL (ref 3.87–5.11)
RDW: 12.9 % (ref 11.5–15.5)
WBC: 11.2 10*3/uL — ABNORMAL HIGH (ref 4.0–10.5)
nRBC: 0 % (ref 0.0–0.2)

## 2023-09-25 LAB — BASIC METABOLIC PANEL
Anion gap: 10 (ref 5–15)
BUN: 14 mg/dL (ref 6–20)
CO2: 26 mmol/L (ref 22–32)
Calcium: 9.7 mg/dL (ref 8.9–10.3)
Chloride: 104 mmol/L (ref 98–111)
Creatinine, Ser: 0.68 mg/dL (ref 0.44–1.00)
GFR, Estimated: >60 mL/min (ref >60)
Glucose, Bld: 89 mg/dL (ref 70–99)
Potassium: 3.5 mmol/L (ref 3.5–5.1)
Sodium: 140 mmol/L (ref 135–145)

## 2023-09-25 LAB — TSH: TSH: 2.316 u[IU]/mL (ref 0.350–4.500)

## 2023-09-25 LAB — TROPONIN I (HIGH SENSITIVITY)
Troponin I (High Sensitivity): <2 ng/L (ref <18)
Troponin I (High Sensitivity): 3 ng/L (ref <18)

## 2023-09-25 LAB — PHOSPHORUS: Phosphorus: 3 mg/dL (ref 2.5–4.6)

## 2023-09-25 LAB — MAGNESIUM: Magnesium: 2.2 mg/dL (ref 1.7–2.4)

## 2023-09-25 MED ORDER — DILTIAZEM HCL 25 MG/5ML IV SOLN
10.0000 mg | Freq: Once | INTRAVENOUS | Status: AC
Start: 2023-09-25 — End: 2023-09-25
  Administered 2023-09-25: 10 mg via INTRAVENOUS
  Filled 2023-09-25: qty 5

## 2023-09-25 MED ORDER — ETOMIDATE 2 MG/ML IV SOLN
0.1500 mg/kg | Freq: Once | INTRAVENOUS | Status: AC
  Administered 2023-09-25: 12.24 mg via INTRAVENOUS
  Filled 2023-09-25: qty 10

## 2023-09-25 MED ORDER — SODIUM CHLORIDE 0.9 % IV BOLUS
500.0000 mL | Freq: Once | INTRAVENOUS | Status: AC
Start: 2023-09-25 — End: 2023-09-25
  Administered 2023-09-25: 500 mL via INTRAVENOUS

## 2023-09-25 MED ORDER — POTASSIUM CHLORIDE CRYS ER 20 MEQ PO TBCR: 20.0000 meq | EXTENDED_RELEASE_TABLET | Freq: Every day | ORAL | 0 refills | Status: DC

## 2023-09-25 MED ORDER — APIXABAN 5 MG PO TABS: 5.0000 mg | ORAL_TABLET | Freq: Two times a day (BID) | ORAL | 0 refills | Status: DC

## 2023-09-25 MED ORDER — APIXABAN 2.5 MG PO TABS
5.0000 mg | ORAL_TABLET | Freq: Once | ORAL | Status: AC
Start: 2023-09-25 — End: 2023-09-25
  Administered 2023-09-25: 5 mg via ORAL
  Filled 2023-09-25: qty 2

## 2023-09-25 MED ORDER — POTASSIUM CHLORIDE 10 MEQ/100ML IV SOLN
10.0000 meq | Freq: Once | INTRAVENOUS | Status: AC
  Administered 2023-09-25: 10 meq via INTRAVENOUS
  Filled 2023-09-25: qty 100

## 2023-09-25 MED ORDER — METOPROLOL TARTRATE 25 MG PO TABS: 12.5000 mg | ORAL_TABLET | Freq: Two times a day (BID) | ORAL | 0 refills | Status: DC

## 2023-09-25 NOTE — Discharge Instructions (Addendum)
 1.  You are being started on a blood thinner called Eliquis.  This is to help prevent stroke in patients with atrial fibrillation.  You were just cardioverted in the emergency department.  You will likely be able to discontinue this medication if you do not have any recurrent episodes of atrial fibrillation.  You have a low risk of stroke however watch closely for any signs or symptoms of unusual weakness numbness, visual changes, imbalance confusion or other concerning changes return to the emergency department immediately. 2.  Your potassium was at the low end of normal.  Sometimes heart rhythms can be irregular or abnormal more easily in the setting of low potassium.  You are being given a low dose of potassium to take for the next week.  Have your doctor recheck these values for you within the next week.  May not need to continue taking potassium. 3.  To keep your heart rate slower and less likely to convert into atrial fibrillation, you are being given a medication called metoprolol.  This slows the heart rate and lowers the blood pressure.  Monitor your heart rate and blood pressure carefully at home.  If your heart rate is becoming less than 60 or your blood pressure is less than 110s over 60s and you feel lightheaded, do not take a dose of Lopressor. 4.  Return to the emergency department immediately if you have new worsening or concerning symptoms. 5.  You have been given referral to follow-up with atrial fibrillation clinic.  They will see you in follow-up and guide your ongoing treatment.

## 2023-09-25 NOTE — ED Provider Notes (Signed)
 [place] EMERGENCY DEPARTMENT AT [facility] Provider Note   CSN: [id number] Arrival date & time: 09/25/23  1649     History  Chief Complaint  Patient presents with   Shortness of Breath   Tachycardia    Anna Evans is a 48 y.o. female.  HPI Patient reports about an hour and a half before coming to the emergency department with symptoms of racing heart and shortness of breath, she felt well.  Patient had just gone outside to throw the Frisbee for her dog.  She had no symptoms.  She reports that she ran in the yard and suddenly felt extremely short of breath after minimal exertion which was not normal for her.  She could feel her heart racing very fast.  Patient denies she had chest pain but her chest still feels kind of tight.  She did not have syncopal episode.  She went in the house to rest and observed her watch which told her her heart rate was elevated and that she might have atrial fibrillation.  He tried resting but the heart rate did continue to go up as high as 160s.  He reports she continued to feel the palpitations and short of breath with any exertion and came to the emergency department.  Patient denies she has ever had A-fib before.  She denies she gets any symptoms of racing or skipping of the heart or unusual shortness of breath.  She reports family members do have atrial fibrillation so she is familiar with that.  She herself is healthy.  She denies any drug use does not smoke.  Occasional social alcohol use.  Does not use supplements or energy drinks.    Home Medications Prior to Admission medications   Medication Sig Start Date End Date Taking? Authorizing Provider  apixaban (ELIQUIS) 5 MG TABS tablet Take 1 tablet (5 mg total) by mouth 2 (two) times daily. 09/25/23 10/25/23 Yes Arby Barrette, MD  metoprolol tartrate (LOPRESSOR) 25 MG tablet Take 0.5 tablets (12.5 mg total) by mouth 2 (two) times daily. 09/25/23  Yes Berniece Abid, Lebron Conners, MD  potassium chloride  SA (KLOR-CON M) 20 MEQ tablet Take 1 tablet (20 mEq total) by mouth daily. 09/25/23  Yes Arby Barrette, MD  Aspirin-Acetaminophen-Caffeine (EXCEDRIN PO) Take 2 tablets by mouth daily as needed (headache).    [provider]  ibuprofen (ADVIL) 200 MG tablet Take 800 mg by mouth every 6 (six) hours as needed for headache or mild pain.    [provider]  melatonin 5 MG TABS Take 10 mg by mouth at bedtime.    [provider]  omeprazole (PRILOSEC) 40 MG capsule Take 40 mg by mouth daily. 12/13/20   [provider]  oxyCODONE (OXY IR/ROXICODONE) 5 MG immediate release tablet Take 1 tablet (5 mg total) by mouth every 6 (six) hours as needed for severe pain. 12/30/20   Violeta Gelinas, MD      Allergies    Penicillins    Review of Systems   Review of Systems  Physical Exam Updated Vital Signs BP 111/68   Pulse 86   Temp 98.3 F (36.8 C) (Oral)   Resp 17   Ht 5\' 7"  (1.702 m)   Wt 81.6 kg   SpO2 100%   BMI 28.19 kg/m  Physical Exam Constitutional:      Comments: Alert nontoxic.  Well-nourished well-developed.  No respiratory distress at rest.  HENT:     Head: Normocephalic and atraumatic.  Mouth/Throat:     Mouth: Mucous membranes are moist.     Pharynx: Oropharynx is clear.  Eyes:     Extraocular Movements: Extraocular movements intact.  Cardiovascular:     Rate and Rhythm: Tachycardia present. Rhythm irregular.  Pulmonary:     Effort: Pulmonary effort is normal.     Breath sounds: Normal breath sounds.  Abdominal:     General: There is no distension.     Palpations: Abdomen is soft.     Tenderness: There is no abdominal tenderness. There is no guarding.  Musculoskeletal:        General: No swelling, tenderness, deformity or signs of injury. Normal range of motion.     Cervical back: Neck supple.     Right lower leg: No edema.     Left lower leg: No edema.  Skin:    General: Skin is warm and dry.  Neurological:     General: No focal  deficit present.     Mental Status: She is oriented to person, place, and time.     Cranial Nerves: No cranial nerve deficit.     Motor: No weakness.     Coordination: Coordination normal.  Psychiatric:        Mood and Affect: Mood normal.     ED Results / Procedures / Treatments   Labs (all labs ordered are listed, but only abnormal results are displayed) Labs Reviewed  CBC - Abnormal; Notable for the following components:      Result Value   WBC 11.2 (*)    All other components within normal limits  BASIC METABOLIC PANEL  MAGNESIUM  PHOSPHORUS  TSH  TROPONIN I (HIGH SENSITIVITY)  TROPONIN I (HIGH SENSITIVITY)    EKG EKG Interpretation Date/Time:  Saturday September 25 2023 16:54:41 EST Ventricular Rate:  158 PR Interval:    QRS Duration:  86 QT Interval:  286 QTC Calculation: 463 R Axis:   54  Text Interpretation: Atrial fibrillation with rapid ventricular response Nonspecific ST and T wave abnormality Abnormal ECG When compared with ECG of 28-Jul-2021 14:28, Atrial fibrillation has replaced Sinus rhythm Vent. rate has increased BY  76 BPM ST now depressed in Inferior leads ST now depressed in Anterolateral leads afib new since first previous Confirmed by Arby Barrette [phone]) on 09/25/2023 5:22:05 PM  Radiology DG Chest 2 View Result Date: 09/25/2023 CLINICAL DATA:  Shortness of breath.  Tachycardia. EXAM: CHEST - 2 VIEW COMPARISON:  07/28/2021 FINDINGS: The heart size and mediastinal contours are within normal limits. Both lungs are clear. The visualized skeletal structures are unremarkable. IMPRESSION: No active cardiopulmonary disease. Electronically Signed   By: Danae Orleans M.D.   On: 09/25/2023 17:33    Procedures .Cardioversion  Date/Time: 09/25/2023 7:48 PM  Performed by: Arby Barrette, MD Authorized by: Arby Barrette, MD   Consent:    Consent obtained:  Verbal   Consent given by:  Patient   Risks discussed:  Cutaneous burn, death, induced arrhythmia and  pain Pre-procedure details:    Cardioversion basis:  Elective   Rhythm:  Atrial fibrillation   Electrode placement:  Anterior-posterior Patient sedated: Yes. Refer to sedation procedure documentation for details of sedation.  Attempt one:    Cardioversion mode:  Synchronous   Shock (Joules):  120   Shock outcome:  No change in rhythm Attempt two:    Cardioversion mode:  Synchronous   Shock (Joules):  150   Shock outcome:  Conversion to normal sinus rhythm Post-procedure details:  Patient status:  Awake   Patient tolerance of procedure:  Tolerated well, no immediate complications .Sedation  Date/Time: 09/25/2023 7:49 PM  Performed by: Arby Barrette, MD Authorized by: Arby Barrette, MD   Consent:    Consent obtained:  Verbal   Consent given by:  Patient   Risks discussed:  Allergic reaction, dysrhythmia, inadequate sedation, nausea, vomiting, respiratory compromise necessitating ventilatory assistance and intubation, prolonged sedation necessitating reversal and prolonged hypoxia resulting in organ damage Universal protocol:    Immediately prior to procedure, a time out was called: yes   Indications:    Procedure performed:  Cardioversion Pre-sedation assessment:    Time since last food or drink:  12   NPO status caution: urgency dictates proceeding with non-ideal NPO status     ASA classification: class 1 - normal, healthy patient     Mouth opening:  3 or more finger widths   Thyromental distance:  4 finger widths   Mallampati score:  I - soft palate, uvula, fauces, pillars visible   Neck mobility: normal     Pre-sedation assessments completed and reviewed: airway patency, cardiovascular function, hydration status, mental status, nausea/vomiting, pain level, respiratory function and temperature   A pre-sedation assessment was completed prior to the start of the procedure Immediate pre-procedure details:    Reassessment: Patient reassessed immediately prior to procedure      Reviewed: vital signs, relevant labs/tests and NPO status     Verified: bag valve mask available, emergency equipment available, intubation equipment available, IV patency confirmed, oxygen available, reversal medications available and suction available   Procedure details (see MAR for exact dosages):    Preoxygenation:  Nasal cannula   Sedation:  Etomidate   Intended level of sedation: deep   Intra-procedure monitoring:  Blood pressure monitoring, cardiac monitor, continuous capnometry, frequent LOC assessments, frequent vital sign checks and continuous pulse oximetry   Intra-procedure events: none     Total Provider sedation time (minutes):  15 Post-procedure details:   A post-sedation assessment was completed following the completion of the procedure.   Recovery: Patient returned to pre-procedure baseline     Post-sedation assessments completed and reviewed: airway patency, cardiovascular function, hydration status, mental status, nausea/vomiting, pain level and respiratory function     Patient is stable for discharge or admission: yes     Procedure completion:  Tolerated well, no immediate complications   CRITICAL CARE Performed by: Arby Barrette   Total critical care time: 45 minutes  Critical care time was exclusive of separately billable procedures and treating other patients.  Critical care was necessary to treat or prevent imminent or life-threatening deterioration.  Critical care was time spent personally by me on the following activities: development of treatment plan with patient and/or surrogate as well as nursing, discussions with consultants, evaluation of patient's response to treatment, examination of patient, obtaining history from patient or surrogate, ordering and performing treatments and interventions, ordering and review of laboratory studies, ordering and review of radiographic studies, pulse oximetry and re-evaluation of patient's condition.    Medications  Ordered in ED Medications  apixaban (ELIQUIS) tablet 5 mg (has no administration in time range)  diltiazem (CARDIZEM) injection 10 mg (10 mg Intravenous Given 09/25/23 1759)  sodium chloride 0.9 % bolus 500 mL ( Intravenous Stopped 09/25/23 1834)  diltiazem (CARDIZEM) injection 10 mg (10 mg Intravenous Given 09/25/23 1839)  etomidate (AMIDATE) injection 12.24 mg (12.24 mg Intravenous Given 09/25/23 1942)  potassium chloride 10 mEq in 100 mL IVPB (0 mEq Intravenous  Stopped 09/25/23 2102)    ED Course/ Medical Decision Making/ A&P                                 Medical Decision Making Amount and/or Complexity of Data Reviewed Labs: ordered. Radiology: ordered.  Risk Prescription drug management.  Patient presents as outlined.  She had abrupt onset of atrial fibrillation about an hour and 1/2 to 2 hours prior to presenting to the emergency department.  Time of onset was clearly definable.  Patient observed her watch to indicate she had atrial fibrillation.  She denies she is ever had it previously.  At baseline she is very healthy.  She is active without medical problems or hypertension.  Patient does have family history of but at somewhat later age than the patient herself.  She is familiar with the diagnosis and the treatment options.  Patient's heart rates are up to 160s.  Respiratory status is stable.  Lungs are clear.  We discussed trialing Cardizem doses and observing to see if she spontaneously converted but recognizing that without spontaneous conversion, patient is appropriate candidate for cardioversion.  8: 22 heart rate is down to low 100s after Cardizem 10 mg IV.  Patient is tolerated this well.  Will give another 10 mg dose and reassess.  Was treated with a second 10 mg dose of Cardizem.  Heart rates were down in the 80s but patient remained in atrial fibrillation.  Blood pressures are stable.  Given distinct time of onset and otherwise healthy.  Patient is a good candidate for  cardioversion.  We reviewed risks and benefits.  She is somewhat familiar with the procedure as one of her parents has had it.  At this time she is agreeable with proceeding.  Cardioversion note.  Patient required a second shock for cardioversion.  She converted at 150 J.  There were no intraprocedure events.  Patient tolerated sedation well.  She awakened with resolution of all symptoms.  Repeat EKG shows sinus rhythm without any ischemic changes.   labs are normal.  Potassium is at the low end of normal at 3.5.  Will do some potassium replacement to decrease risk of recurrence of arrhythmia.  Patient has gotten 1 dose of IV potassium.  Will start her on 20 mEq a day.  Patient was cardioverted successfully.  Will initiate Eliquis twice daily post cardioversion.  Patient will be instructed on follow-up with atrial fibrillation clinic.  She does have low CHA2DS2-VASc risk score and likely without recurrence will not need ongoing anticoagulation.  CHA2DS2-VASc Score = 1   The patient's score is based upon:        ASSESSMENT AND PLAN: Paroxysmal Atrial Fibrillation (ICD10:  I48.0) The patient's CHA2DS2-VASc score is 1 , indicating a 0.6 % annual risk of stroke.        Signed,  Arby Barrette, MD    09/25/2023 9:20 PM          Final Clinical Impression(s) / ED Diagnoses Final diagnoses:  New onset atrial fibrillation [facility])  Atrial fibrillation status post cardioversion [facility])    Rx / DC Orders ED Discharge Orders          Ordered    Amb Referral to AFIB Clinic        09/25/23 2114    apixaban (ELIQUIS) 5 MG TABS tablet  2 times daily        09/25/23 2114  metoprolol tartrate (LOPRESSOR) 25 MG tablet  2 times daily        09/25/23 2114    potassium chloride SA (KLOR-CON M) 20 MEQ tablet  Daily        09/25/23 2114              Arby Barrette, MD 09/25/23 2125

## 2023-09-25 NOTE — ED Triage Notes (Signed)
 Pt caox4, NAD c/o chest tightness, palpitations and SOB. Pt states she was outside playing with her dogs this afternoon when she had the sudden onset of s/s.

## 2023-09-25 NOTE — ED Notes (Signed)
 First synchronized cardioversion at 120j delivered at 1943. No rhythm change. Pt remained in A-Fib.

## 2023-09-25 NOTE — ED Notes (Signed)
 Second synchronized cardioversion shock at 150j was delivered. Pt had rhythm change to sinus Tachycardia.

## 2023-09-25 NOTE — ED Notes (Signed)
 Patient transported to x-ray. ?

## 2023-09-25 NOTE — ED Notes (Addendum)
 1921:  RT to bedside for Cardioversion. Placed patient on 2L supplemental O2 via [place] with EtCO2 in place. BMV ready and available if needed. Suction assembled, turned on and ready for use. Emergency equipment available at bedside. VS: HR 121 (Afib) RR 16 BP 116/95 SpO2 100 EtCO2 29-32. Post successful Cardioiversion: 1949:  VS: HR 100 (Sinus) RR 13 BP 134/72 SpO2 100 EtCO2 34. Patient able to communicate, move extremities, open eyes and convey how she feels. Placed back to room air. RT exit room at 1952.

## 2023-10-04 ENCOUNTER — Ambulatory Visit (HOSPITAL_COMMUNITY)
Admission: RE | Admit: 2023-10-04 | Discharge: 2023-10-04 | Disposition: A | Discharge status: Home / Self Care | Source: Ambulatory Visit | Attending: Internal Medicine | Admitting: Internal Medicine

## 2023-10-04 VITALS — BP 104/68 | HR 66 | Ht 67.0 in | Wt 186.0 lb

## 2023-10-04 DIAGNOSIS — Z7901 Long term (current) use of anticoagulants: Secondary | ICD-10-CM | POA: Diagnosis not present

## 2023-10-04 DIAGNOSIS — I4891 Unspecified atrial fibrillation: Secondary | ICD-10-CM | POA: Diagnosis present

## 2023-10-04 DIAGNOSIS — I480 Paroxysmal atrial fibrillation: Secondary | ICD-10-CM | POA: Diagnosis not present

## 2023-10-04 NOTE — Patient Instructions (Signed)
 Echocardiogram and sleep study - ordered - departments will call for appointments  When current prescription completed for metoprolol and eliquis can stop

## 2023-10-04 NOTE — Progress Notes (Signed)
 Primary Care Physician: Blair Heys, MD (Inactive) Primary Cardiologist: None Electrophysiologist: None     Referring Physician: ED     Anna Evans is a 48 y.o. female with no significant past medical history who presents for consultation in the [facility]. ED visit on 09/25/23 for SOB and heart racing found to be in new onset Afib with RVR. She underwent successful DCCV in the ED on 3/8. She does note family history of Afib. Discharged on Lopressor 25 mg BID. Patient is on Eliquis for a CHADS2VASC score of 1.  On evaluation today, she is currently in NSR. She was playing with her dog when episode happened suddenly. She has had no episodes of Afib since ED visit. No energy drinks. No tobacco use. Occasional infrequent alcohol use. She snores sometimes.   Today, she denies symptoms of chest pain, orthopnea, PND, lower extremity edema, dizziness, presyncope, syncope, snoring, daytime somnolence, bleeding, or neurologic sequela. The patient is tolerating medications without difficulties and is otherwise without complaint today.    Atrial Fibrillation Risk Factors:  she does have symptoms or diagnosis of sleep apnea.  she has a BMI of Body mass index is 29.13 kg/m.Marland Kitchen Filed Weights   10/04/23 1432  Weight: 84.4 kg    Current Outpatient Medications  Medication Sig Dispense Refill   apixaban (ELIQUIS) 5 MG TABS tablet Take 1 tablet (5 mg total) by mouth 2 (two) times daily. 60 tablet 0   Aspirin-Acetaminophen-Caffeine (EXCEDRIN PO) Take 2 tablets by mouth daily as needed (headache).     cetirizine (ZYRTEC) 10 MG tablet 1 tablet Orally as needed     fluticasone (FLONASE) 50 MCG/ACT nasal spray 1 spray in each nostril Nasally Once a day as needed     LINZESS 145 MCG CAPS capsule Take 145 mcg by mouth as needed.     melatonin 5 MG TABS Take 10 mg by mouth at bedtime.     metoprolol tartrate (LOPRESSOR) 25 MG tablet Take 0.5 tablets (12.5 mg total)  by mouth 2 (two) times daily. 30 tablet 0   omeprazole (PRILOSEC) 40 MG capsule Take 40 mg by mouth daily.     tirzepatide (ZEPBOUND) 10 MG/0.5ML Pen Inject 10 mg into the skin once a week.     trimethoprim-polymyxin b (POLYTRIM) ophthalmic solution Insert 2 drops 4 times daily x 7 days     Vitamin D, Ergocalciferol, (DRISDOL) 1.25 MG (50000 UNIT) CAPS capsule Take 50,000 Units by mouth every 7 (seven) days.     No current facility-administered medications for this encounter.    Atrial Fibrillation Management history:  Previous antiarrhythmic drugs: none Previous cardioversions: 09/25/23 (ED) Previous ablations: none Anticoagulation history: Eliquis   ROS- All systems are reviewed and negative except as per the HPI above.  Physical Exam: BP 104/68   Pulse 66   Ht 5\' 7"  (1.702 m)   Wt 84.4 kg   BMI 29.13 kg/m   GEN: Well nourished, well developed in no acute distress NECK: No JVD; No carotid bruits CARDIAC: Regular rate and rhythm, no murmurs, rubs, gallops RESPIRATORY:  Clear to auscultation without rales, wheezing or rhonchi  ABDOMEN: Soft, non-tender, non-distended EXTREMITIES:  No edema; No deformity   EKG today demonstrates  Vent. rate 66 BPM PR interval 132 ms QRS duration 90 ms QT/QTcB 402/421 ms P-R-T axes 70 65 61 Normal sinus rhythm Normal ECG When compared with ECG of 25-Sep-2023 19:45, PREVIOUS ECG IS PRESENT  Echo N/A  ASSESSMENT & PLAN  CHA2DS2-VASc Score = 1  The patient's score is based upon: CHF History: 0 HTN History: 0 Diabetes History: 0 Stroke History: 0 Vascular Disease History: 0 Age Score: 0 Gender Score: 1       ASSESSMENT AND PLAN: Paroxysmal Atrial Fibrillation (ICD10:  I48.0) The patient's CHA2DS2-VASc score is 1, indicating a 0.6% annual risk of stroke.   S/p successful DCCV in ED on 09/25/23.  She is currently in NSR. Education provided about Afib. Discussion about possible triggers. We will order baseline echocardiogram. We  will order sleep study per patient request. She will stop Eliquis and metoprolol after prescription finishes which is reasonable given low risk score. She will continue monitoring rhythm with Apple watch. After discussion, we will proceed with conservative observation at this time.     Follow up Afib clinic prn.    Lake Bells, PA-C  Afib Clinic [facility] [address] [place], [place] [address] [phone]

## 2023-10-07 NOTE — ED Notes (Signed)
 Unable to accurately rate pain r/t procedure.   Pt appears comfortable and in no acute distress.

## 2023-10-08 ENCOUNTER — Other Ambulatory Visit (HOSPITAL_COMMUNITY): Payer: Self-pay

## 2023-10-08 DIAGNOSIS — I4891 Unspecified atrial fibrillation: Secondary | ICD-10-CM

## 2023-10-18 ENCOUNTER — Emergency Department (HOSPITAL_BASED_OUTPATIENT_CLINIC_OR_DEPARTMENT_OTHER)

## 2023-10-18 ENCOUNTER — Other Ambulatory Visit: Payer: Self-pay

## 2023-10-18 ENCOUNTER — Encounter (HOSPITAL_BASED_OUTPATIENT_CLINIC_OR_DEPARTMENT_OTHER): Payer: Self-pay | Admitting: Emergency Medicine

## 2023-10-18 ENCOUNTER — Emergency Department (HOSPITAL_BASED_OUTPATIENT_CLINIC_OR_DEPARTMENT_OTHER)
Admission: EM | Admit: 2023-10-18 | Discharge: 2023-10-18 | Disposition: A | Discharge status: Home / Self Care | Attending: Emergency Medicine | Admitting: Emergency Medicine

## 2023-10-18 DIAGNOSIS — E876 Hypokalemia: Secondary | ICD-10-CM | POA: Diagnosis not present

## 2023-10-18 DIAGNOSIS — I480 Paroxysmal atrial fibrillation: Secondary | ICD-10-CM | POA: Insufficient documentation

## 2023-10-18 DIAGNOSIS — Z7901 Long term (current) use of anticoagulants: Secondary | ICD-10-CM | POA: Diagnosis not present

## 2023-10-18 DIAGNOSIS — I4891 Unspecified atrial fibrillation: Secondary | ICD-10-CM | POA: Diagnosis present

## 2023-10-18 HISTORY — DX: Unspecified atrial fibrillation: I48.91

## 2023-10-18 LAB — BASIC METABOLIC PANEL WITH GFR
Anion gap: 8 (ref 5–15)
BUN: 12 mg/dL (ref 6–20)
CO2: 24 mmol/L (ref 22–32)
Calcium: 8.9 mg/dL (ref 8.9–10.3)
Chloride: 104 mmol/L (ref 98–111)
Creatinine, Ser: 0.67 mg/dL (ref 0.44–1.00)
GFR, Estimated: >60 mL/min (ref >60)
Glucose, Bld: 100 mg/dL — ABNORMAL HIGH (ref 70–99)
Potassium: 3.4 mmol/L — ABNORMAL LOW (ref 3.5–5.1)
Sodium: 136 mmol/L (ref 135–145)

## 2023-10-18 LAB — CBC
HCT: 39.9 % (ref 36.0–46.0)
Hemoglobin: 13.8 g/dL (ref 12.0–15.0)
MCH: 30.2 pg (ref 26.0–34.0)
MCHC: 34.6 g/dL (ref 30.0–36.0)
MCV: 87.3 fL (ref 80.0–100.0)
Platelets: 298 10*3/uL (ref 150–400)
RBC: 4.57 MIL/uL (ref 3.87–5.11)
RDW: 13 % (ref 11.5–15.5)
WBC: 9.4 10*3/uL (ref 4.0–10.5)
nRBC: 0 % (ref 0.0–0.2)

## 2023-10-18 LAB — MAGNESIUM: Magnesium: 2 mg/dL (ref 1.7–2.4)

## 2023-10-18 MED ORDER — POTASSIUM CHLORIDE CRYS ER 20 MEQ PO TBCR: 20.0000 meq | EXTENDED_RELEASE_TABLET | Freq: Two times a day (BID) | ORAL | 0 refills | Status: DC

## 2023-10-18 MED ORDER — PROPOFOL 10 MG/ML IV BOLUS
0.5000 mg/kg | Freq: Once | INTRAVENOUS | Status: AC
  Administered 2023-10-18: 42.2 mg via INTRAVENOUS
  Filled 2023-10-18: qty 20

## 2023-10-18 MED ORDER — METOPROLOL TARTRATE 25 MG PO TABS: 12.5000 mg | ORAL_TABLET | Freq: Two times a day (BID) | ORAL | 0 refills | Status: DC

## 2023-10-18 MED ORDER — APIXABAN 5 MG PO TABS: 5.0000 mg | ORAL_TABLET | Freq: Two times a day (BID) | ORAL | 0 refills | Status: DC

## 2023-10-18 MED ORDER — POTASSIUM CHLORIDE CRYS ER 20 MEQ PO TBCR
40.0000 meq | EXTENDED_RELEASE_TABLET | Freq: Once | ORAL | Status: AC
  Administered 2023-10-18: 40 meq via ORAL
  Filled 2023-10-18: qty 2

## 2023-10-18 NOTE — ED Provider Notes (Signed)
 [place] EMERGENCY DEPARTMENT AT [facility] Provider Note   CSN: [id number] Arrival date & time: 10/18/23  0547     History  Chief Complaint  Patient presents with   Atrial Fibrillation    Anna Evans is a 48 y.o. female.  The history is provided by the patient.  Atrial Fibrillation  She has a history of paroxysmal atrial fibrillation and woke up this morning with a feeling that her heart was racing.  She used her watch to check her heart rate and it was over 140.  She is currently on rivaroxaban for her atrial fibrillation, and has an echocardiogram scheduled.   Home Medications Prior to Admission medications   Medication Sig Start Date End Date Taking? Authorizing Provider  potassium chloride SA (KLOR-CON M) 20 MEQ tablet Take 1 tablet (20 mEq total) by mouth 2 (two) times daily. 10/18/23  Yes Dione Booze, MD  apixaban (ELIQUIS) 5 MG TABS tablet Take 1 tablet (5 mg total) by mouth 2 (two) times daily. 10/18/23 11/17/23  Dione Booze, MD  Aspirin-Acetaminophen-Caffeine (EXCEDRIN PO) Take 2 tablets by mouth daily as needed (headache).    [provider]  cetirizine (ZYRTEC) 10 MG tablet 1 tablet Orally as needed    [provider]  fluticasone (FLONASE) 50 MCG/ACT nasal spray 1 spray in each nostril Nasally Once a day as needed    [provider]  LINZESS 145 MCG CAPS capsule Take 145 mcg by mouth as needed. 06/21/23   [provider]  melatonin 5 MG TABS Take 10 mg by mouth at bedtime.    [provider]  metoprolol tartrate (LOPRESSOR) 25 MG tablet Take 0.5 tablets (12.5 mg total) by mouth 2 (two) times daily. 10/18/23   Dione Booze, MD  omeprazole (PRILOSEC) 40 MG capsule Take 40 mg by mouth daily. 12/13/20   [provider]  tirzepatide (ZEPBOUND) 10 MG/0.5ML Pen Inject 10 mg into the skin once a week.    [provider]  trimethoprim-polymyxin b (POLYTRIM) ophthalmic solution Insert 2 drops 4 times  daily x 7 days    [provider]  Vitamin D, Ergocalciferol, (DRISDOL) 1.25 MG (50000 UNIT) CAPS capsule Take 50,000 Units by mouth every 7 (seven) days.    [provider]      Allergies    Amphetamine-dextroamphetamine and Penicillins    Review of Systems   Review of Systems  All other systems reviewed and are negative.   Physical Exam Updated Vital Signs BP 110/69   Pulse 87   Temp 98.3 F (36.8 C) (Oral)   Resp 16   Ht 5\' 7"  (1.702 m)   Wt 84.4 kg   SpO2 96%   BMI 29.13 kg/m  Physical Exam Vitals and nursing note reviewed.   48 year old female, resting comfortably and in no acute distress. Vital signs are normal. Oxygen saturation is 96%, which is normal. Head is normocephalic and atraumatic. PERRLA, EOMI. Oropharynx is clear. Neck is nontender and supple without adenopathy or JVD. Back is nontender and there is no CVA tenderness. Lungs are clear without rales, wheezes, or rhonchi. Chest is nontender. Heart is tachycardic and irregular without murmur. Abdomen is soft, flat, nontender. Extremities have no cyanosis or edema, full range of motion is present. Skin is warm and dry without rash. Neurologic: Mental status is normal, moves all extremities equally.  ED Results / Procedures / Treatments   Labs (all labs ordered are listed, but only abnormal results are displayed) Labs  Reviewed  BASIC METABOLIC PANEL WITH GFR - Abnormal; Notable for the following components:      Result Value   Potassium 3.4 (*)    Glucose, Bld 100 (*)    All other components within normal limits  CBC  MAGNESIUM    EKG EKG Interpretation Date/Time:  Monday October 18 2023 05:54:35 EDT Ventricular Rate:  151 PR Interval:    QRS Duration:  86 QT Interval:  308 QTC Calculation: 489 R Axis:   62  Text Interpretation: Atrial fibrillation with rapid ventricular response Borderline repolarization abnormality Borderline prolonged QT interval When compared with ECG of  10/04/2023, Atrial fibrillation with rapid ventricular response has replaced Sinus rhythm REPOLARIZATION ABNORMALITY is now present  - likely rate-related Confirmed by Dione Booze ([id number]) on 10/18/2023 6:09:45 AM   EKG Interpretation Date/Time:  Monday October 18 2023 06:29:33 EDT Ventricular Rate:  87 PR Interval:  138 QRS Duration:  102 QT Interval:  381 QTC Calculation: 459 R Axis:   65  Text Interpretation: Sinus rhythm Low voltage, precordial leads When compared with ECG of EARLIER SAME DATE Sinus rhythm has replaced Atrial fibrillation Confirmed by Dione Booze ([id number]) on 10/18/2023 7:26:00 AM       Procedures .Sedation  Date/Time: 10/18/2023 6:10 AM  Performed by: Dione Booze, MD Authorized by: Dione Booze, MD   Consent:    Consent obtained:  Verbal   Consent given by:  Patient   Risks discussed:  Allergic reaction, dysrhythmia, inadequate sedation, nausea, prolonged hypoxia resulting in organ damage, prolonged sedation necessitating reversal, respiratory compromise necessitating ventilatory assistance and intubation and vomiting   Alternatives discussed:  Analgesia without sedation, anxiolysis and regional anesthesia Universal protocol:    Procedure explained and questions answered to patient or proxy's satisfaction: yes     Relevant documents present and verified: yes     Test results available: yes     Imaging studies available: yes     Required blood products, implants, devices, and special equipment available: yes     Site/side marked: yes     Immediately prior to procedure, a time out was called: yes     Patient identity confirmed:  Verbally with patient and arm band Indications:    Procedure performed:  Cardioversion   Procedure necessitating sedation performed by:  Physician performing sedation Pre-sedation assessment:    Time since last food or drink:  6 hours   ASA classification: class 1 - normal, healthy patient     Mouth opening:  3 or more finger widths    Thyromental distance:  4 finger widths   Mallampati score:  I - soft palate, uvula, fauces, pillars visible   Neck mobility: normal     Pre-sedation assessments completed and reviewed: airway patency, cardiovascular function, hydration status, mental status, nausea/vomiting, pain level, respiratory function and temperature   A pre-sedation assessment was completed prior to the start of the procedure Immediate pre-procedure details:    Reassessment: Patient reassessed immediately prior to procedure     Reviewed: vital signs, relevant labs/tests and NPO status     Verified: bag valve mask available, emergency equipment available, intubation equipment available, IV patency confirmed, oxygen available and suction available   Procedure details (see MAR for exact dosages):    Preoxygenation:  Nasal cannula   Sedation:  Propofol   Intended level of sedation: deep   Intra-procedure monitoring:  Blood pressure monitoring, cardiac monitor, continuous pulse oximetry, frequent LOC assessments, frequent vital sign checks and continuous capnometry   Intra-procedure  events: none     Total Provider sedation time (minutes):  30 Post-procedure details:   A post-sedation assessment was completed following the completion of the procedure.   Attendance: Constant attendance by certified staff until patient recovered     Recovery: Patient returned to pre-procedure baseline     Post-sedation assessments completed and reviewed: airway patency, cardiovascular function, hydration status, mental status, nausea/vomiting, pain level, respiratory function and temperature     Patient is stable for discharge or admission: yes     Procedure completion:  Tolerated well, no immediate complications .Cardioversion  Date/Time: 10/18/2023 6:15 AM  Performed by: Dione Booze, MD Authorized by: Dione Booze, MD   Consent:    Consent obtained:  Written   Consent given by:  Patient   Risks discussed:  Cutaneous burn, induced  arrhythmia and death   Alternatives discussed:  Rate-control medication Pre-procedure details:    Cardioversion basis:  Emergent   Rhythm:  Atrial fibrillation   Electrode placement:  Anterior-posterior Patient sedated: Yes. Refer to sedation procedure documentation for details of sedation.  Attempt one:    Cardioversion mode:  Synchronous   Waveform:  Biphasic   Shock (Joules):  120   Shock outcome:  Conversion to normal sinus rhythm Post-procedure details:    Patient status:  Alert   Patient tolerance of procedure:  Tolerated well, no immediate complications   Cardiac monitor shows atrial fibrillation with rapid ventricular response, per my interpretation.  Medications Ordered in ED Medications  potassium chloride SA (KLOR-CON M) CR tablet 40 mEq (has no administration in time range)  propofol (DIPRIVAN) 10 mg/mL bolus/IV push 42.2 mg (42.2 mg Intravenous Given 10/18/23 8469)    ED Course/ Medical Decision Making/ A&P         CHA2DS2-VASc Score: 1                        Medical Decision Making Amount and/or Complexity of Data Reviewed Labs: ordered. Radiology: ordered.  Risk Prescription drug management.   Paroxysmal atrial fibrillation.  I reviewed her past records, and note ED visit on 09/25/2023 for paroxysmal atrial fibrillation.  I have discussed treatment options with the patient and we have elected to proceed with DC cardioversion.  I have reviewed her electrocardiogram, my interpretation is atrial fibrillation with rapid ventricular response.  CHA2DS2-VASc score is 1.  Cardioversion successfully restored sinus rhythm.  I reviewed her laboratory test, my interpretation is mild hypokalemia.  I have ordered a dose of oral potassium.  I am discharging her with new prescriptions for metoprolol, apixaban, and oral potassium.  I am referring her to the atrial fibrillation clinic for follow-up.  Given 2 episodes of atrial fibrillation in less than a month, she might be a good  candidate for ablation.  Final Clinical Impression(s) / ED Diagnoses Final diagnoses:  Paroxysmal atrial fibrillation (HCC)    Rx / DC Orders ED Discharge Orders          Ordered    Amb referral to AFIB Clinic        10/18/23 0604    apixaban (ELIQUIS) 5 MG TABS tablet  2 times daily        10/18/23 0725    metoprolol tartrate (LOPRESSOR) 25 MG tablet  2 times daily        10/18/23 0725    potassium chloride SA (KLOR-CON M) 20 MEQ tablet  2 times daily        10/18/23 0725  Dione Booze, MD 10/18/23 [phone]

## 2023-10-18 NOTE — Sedation Documentation (Addendum)
 Pt sync cardioverted at 120J-Atrial Fib to SR. Tolerated well. EKG obtained.

## 2023-10-18 NOTE — ED Notes (Addendum)
 Pt placed on 2lt ETCO2 @ 36  for cardiovert SATS 100% throughout, HR 119, RR 16 no disterss noted, ambu bag at [facility].

## 2023-10-18 NOTE — ED Triage Notes (Signed)
 Pt reports waking up in A fib x 1 hour ago, reports has not missed any doses of meds and took extra metoprolol at 0500 with no improvement

## 2023-10-20 ENCOUNTER — Ambulatory Visit (HOSPITAL_COMMUNITY)
Admission: RE | Admit: 2023-10-20 | Discharge: 2023-10-20 | Disposition: A | Discharge status: Home / Self Care | Source: Ambulatory Visit | Attending: Internal Medicine | Admitting: Internal Medicine

## 2023-10-20 VITALS — BP 106/78 | HR 71 | Ht 67.0 in | Wt 182.4 lb

## 2023-10-20 DIAGNOSIS — Z7901 Long term (current) use of anticoagulants: Secondary | ICD-10-CM | POA: Diagnosis not present

## 2023-10-20 DIAGNOSIS — I4891 Unspecified atrial fibrillation: Secondary | ICD-10-CM

## 2023-10-20 DIAGNOSIS — I480 Paroxysmal atrial fibrillation: Secondary | ICD-10-CM | POA: Diagnosis not present

## 2023-10-20 DIAGNOSIS — Z79899 Other long term (current) drug therapy: Secondary | ICD-10-CM | POA: Diagnosis not present

## 2023-10-20 MED ORDER — METOPROLOL TARTRATE 25 MG PO TABS: 12.5000 mg | ORAL_TABLET | Freq: Two times a day (BID) | ORAL | 3 refills | Status: DC

## 2023-10-20 MED ORDER — FLECAINIDE ACETATE 50 MG PO TABS: 50.0000 mg | ORAL_TABLET | Freq: Two times a day (BID) | ORAL | 3 refills | Status: DC

## 2023-10-20 MED ORDER — APIXABAN 5 MG PO TABS: 5.0000 mg | ORAL_TABLET | Freq: Two times a day (BID) | ORAL | 3 refills | Status: DC

## 2023-10-20 NOTE — Progress Notes (Signed)
 Primary Care Physician: Blair Heys, MD (Inactive) Primary Cardiologist: None Electrophysiologist: None     Referring Physician: ED    Anna Evans is a 48 y.o. female with no significant past medical history who presents for consultation in the [facility]. ED visit on 09/25/23 for SOB and heart racing found to be in new onset Afib with RVR. She underwent successful DCCV in the ED on 3/8. She does note family history of Afib. Discharged on Lopressor 25 mg BID. Patient is on Eliquis for a CHADS2VASC score of 1.  On follow up 10/20/23, she is currently in NSR. She unfortunately required another visit to ED on 3/31 for Afib with RVR and underwent successful DCCV in the ED. Patient does not have clear etiology for this episode; appears it woke her up in the middle of the night. She has not missed any doses of Lopressor 12.5 mg BID or Eliquis 5 mg BID.  Today, she denies symptoms of chest pain, orthopnea, PND, lower extremity edema, dizziness, presyncope, syncope, snoring, daytime somnolence, bleeding, or neurologic sequela. The patient is tolerating medications without difficulties and is otherwise without complaint today.    Atrial Fibrillation Risk Factors:  she does have symptoms or diagnosis of sleep apnea.  she has a BMI of Body mass index is 28.57 kg/m.Marland Kitchen Filed Weights   10/20/23 1457  Weight: 82.7 kg     Current Outpatient Medications  Medication Sig Dispense Refill   apixaban (ELIQUIS) 5 MG TABS tablet Take 1 tablet (5 mg total) by mouth 2 (two) times daily. 60 tablet 0   cetirizine (ZYRTEC) 10 MG tablet 1 tablet Orally as needed     fluticasone (FLONASE) 50 MCG/ACT nasal spray 1 spray in each nostril Nasally Once a day as needed     LINZESS 145 MCG CAPS capsule Take 145 mcg by mouth as needed.     melatonin 5 MG TABS Take 10 mg by mouth at bedtime.     metoprolol tartrate (LOPRESSOR) 25 MG tablet Take 0.5 tablets (12.5 mg total) by mouth  2 (two) times daily. 30 tablet 0   omeprazole (PRILOSEC) 40 MG capsule Take 40 mg by mouth daily.     potassium chloride SA (KLOR-CON M) 20 MEQ tablet Take 1 tablet (20 mEq total) by mouth 2 (two) times daily. 10 tablet 0   tirzepatide (ZEPBOUND) 10 MG/0.5ML Pen Inject 10 mg into the skin. Taking every 10 days     No current facility-administered medications for this encounter.    Atrial Fibrillation Management history:  Previous antiarrhythmic drugs: none Previous cardioversions: 09/25/23 (ED), 10/18/23 (ED) Previous ablations: none Anticoagulation history: Eliquis   ROS- All systems are reviewed and negative except as per the HPI above.  Physical Exam: BP 106/78   Pulse 71   Ht 5\' 7"  (1.702 m)   Wt 82.7 kg   BMI 28.57 kg/m   GEN- The patient is well appearing, alert and oriented x 3 today.   Neck - no JVD or carotid bruit noted Lungs- Clear to ausculation bilaterally, normal work of breathing Heart- Regular rate and rhythm, no murmurs, rubs or gallops, PMI not laterally displaced Extremities- no clubbing, cyanosis, or edema Skin - no rash or ecchymosis noted    EKG today demonstrates  Vent. rate 71 BPM PR interval 116 ms QRS duration 94 ms QT/QTcB 390/423 ms P-R-T axes 69 48 48 Normal sinus rhythm with sinus arrhythmia Cannot rule out Anterior infarct , age undetermined Abnormal  ECG When compared with ECG of 18-Oct-2023 06:29, PREVIOUS ECG IS PRESENT  Echo scheduled for 10/29/23.   ASSESSMENT & PLAN CHA2DS2-VASc Score = 1  The patient's score is based upon: CHF History: 0 HTN History: 0 Diabetes History: 0 Stroke History: 0 Vascular Disease History: 0 Age Score: 0 Gender Score: 1       ASSESSMENT AND PLAN: Paroxysmal Atrial Fibrillation (ICD10:  I48.0) The patient's CHA2DS2-VASc score is 1, indicating a 0.6% annual risk of stroke.   S/p successful DCCV in ED on 09/25/23. S/p successful DCCV in ED on 10/18/23.  She is currently in NSR. She has had two  episodes of Afib in March which required cardioversion in the ED. Due to this, will be more aggressive with rhythm control in hopes of preventing further ED visit. We discussed rhythm control options. After discussion, we will begin flecainide 50 mg BID. Continue Lopressor with current dosage of 12.5 mg BID. Repeat ECG in 7-10 days and then will schedule TST afterwards. Will refer to EP to discuss ablation. Continue Eliquis 5 mg BID without interruption due to cardioversion on 3/31. She will complete potassium supplementation.   Repeat ECG in 7-10 days.   Anna Bells, PA-C  [facility] [address] [place], [place] [address] [phone]

## 2023-10-20 NOTE — Patient Instructions (Signed)
Start flecainide 50mg twice a day 

## 2023-10-25 ENCOUNTER — Telehealth (HOSPITAL_COMMUNITY): Payer: Self-pay | Admitting: *Deleted

## 2023-10-25 MED ORDER — FLECAINIDE ACETATE 50 MG PO TABS: 25.0000 mg | ORAL_TABLET | Freq: Two times a day (BID) | ORAL | Status: DC

## 2023-10-25 NOTE — Telephone Encounter (Signed)
 Patient has experienced dizziness since starting flecainide. Denies any change in heart rate since starting flecainide just the dizziness. Discussed with Landry Mellow PA will decrease flecainide to 25mg  twice a day. If no improvement may need to switch to multaq. Pt in agreement will notify if no improvement.

## 2023-10-28 ENCOUNTER — Ambulatory Visit (HOSPITAL_COMMUNITY)
Admission: RE | Admit: 2023-10-28 | Discharge: 2023-10-28 | Disposition: A | Discharge status: Home / Self Care | Source: Ambulatory Visit | Attending: Internal Medicine | Admitting: Internal Medicine

## 2023-10-28 DIAGNOSIS — I480 Paroxysmal atrial fibrillation: Secondary | ICD-10-CM | POA: Insufficient documentation

## 2023-10-28 DIAGNOSIS — Z79899 Other long term (current) drug therapy: Secondary | ICD-10-CM | POA: Insufficient documentation

## 2023-10-28 DIAGNOSIS — Z5181 Encounter for therapeutic drug level monitoring: Secondary | ICD-10-CM | POA: Insufficient documentation

## 2023-10-28 NOTE — Progress Notes (Signed)
 Patient began flecainide 50 mg BID on 4/2. Due to dizziness, patient is taking flecainide 50 mg BID Monday through Friday and 25 mg BID on Saturday and Sunday.  ECG shows: Vent. rate 75 BPM PR interval 150 ms QRS duration 96 ms QT/QTcB 396/442 ms P-R-T axes 75 50 69 Normal sinus rhythm Normal ECG When compared with ECG of 20-Oct-2023 15:07, PREVIOUS ECG IS PRESENT   Continue current regimen. If patient notes worsening dizziness and wishes to discontinue flecainide, would allow for 72 hour washout and begin Multaq 400 mg BID. Will schedule TST. Patient will follow up as scheduled to establish with Dr. Elberta Fortis.

## 2023-10-28 NOTE — Addendum Note (Signed)
 Encounter addended by: Eustace Pen, PA-C on: 10/28/2023 3:36 PM  Actions taken: Order list changed, Diagnosis association updated

## 2023-10-29 ENCOUNTER — Other Ambulatory Visit (HOSPITAL_COMMUNITY): Payer: Self-pay

## 2023-10-29 ENCOUNTER — Ambulatory Visit (HOSPITAL_COMMUNITY)
Admission: RE | Admit: 2023-10-29 | Discharge: 2023-10-29 | Disposition: A | Discharge status: Home / Self Care | Source: Ambulatory Visit | Attending: Internal Medicine | Admitting: Internal Medicine

## 2023-10-29 DIAGNOSIS — I4891 Unspecified atrial fibrillation: Secondary | ICD-10-CM | POA: Diagnosis not present

## 2023-10-29 DIAGNOSIS — Z5181 Encounter for therapeutic drug level monitoring: Secondary | ICD-10-CM | POA: Diagnosis not present

## 2023-10-29 DIAGNOSIS — I480 Paroxysmal atrial fibrillation: Secondary | ICD-10-CM

## 2023-10-29 LAB — ECHOCARDIOGRAM COMPLETE
AR max vel: 3.72 cm2
AV Area VTI: 3.93 cm2
AV Area mean vel: 3.58 cm2
AV Mean grad: 4 mmHg
AV Peak grad: 6.8 mmHg
Ao pk vel: 1.3 m/s
Area-P 1/2: 3.91 cm2
S' Lateral: 3.2 cm

## 2023-11-04 ENCOUNTER — Ambulatory Visit: Attending: Internal Medicine

## 2023-11-04 ENCOUNTER — Other Ambulatory Visit (HOSPITAL_COMMUNITY): Payer: Self-pay | Admitting: Internal Medicine

## 2023-11-04 DIAGNOSIS — I480 Paroxysmal atrial fibrillation: Secondary | ICD-10-CM

## 2023-11-04 DIAGNOSIS — Z5181 Encounter for therapeutic drug level monitoring: Secondary | ICD-10-CM

## 2023-11-04 DIAGNOSIS — Z79899 Other long term (current) drug therapy: Secondary | ICD-10-CM | POA: Diagnosis not present

## 2023-11-04 LAB — EXERCISE TOLERANCE TEST
Angina Index: 0
Duke Treadmill Score: 9
Estimated workload: 17
Exercise duration (min): 9 min
Exercise duration (sec): 13 s
MPHR: 173 {beats}/min
Peak HR: 155 {beats}/min
Percent HR: 89 %
RPE: 17
Rest HR: 77 {beats}/min
ST Depression (mm): 0 mm

## 2023-11-08 ENCOUNTER — Encounter (HOSPITAL_COMMUNITY): Payer: Self-pay

## 2023-11-14 NOTE — Progress Notes (Unsigned)
 Electrophysiology Office Note:   Date:  11/16/2023  ID:  Anna Evans, DOB 03/01/1976, MRN [id number]  Primary Cardiologist: None Primary Heart Failure: None Electrophysiologist: None      History of Present Illness:   Anna Evans is a 48 y.o. female with h/o atrial fibrillation seen today for  for Electrophysiology evaluation of atrial fibrillation at the request of Anna Evans.    She presented to the emergency room 09/25/2023 with shortness of breath and palpitations.  She was found to be in atrial fibrillation.  She underwent successful cardioversion in the emergency room.  She does have a family history of atrial fibrillation.  She was started on metoprolol  and Eliquis .  She had another episode of atrial fibrillation requiring ER visit 10/18/2023.  She underwent successful cardioversion.  She has since been started on flecainide .  Today, denies symptoms of palpitations, chest pain, shortness of breath, orthopnea, PND, lower extremity edema, claudication, dizziness, presyncope, syncope, bleeding, or neurologic sequela. The patient is tolerating medications without difficulties.  She feels well in sinus rhythm.  When she is in atrial fibrillation, she has significant palpitations.  The longer she is in it, she has fatigue and shortness of breath.  She finds it difficult to do just about any activity.  She was going to take a shower last time she was going to go to the hospital, but was unable to do this.  Review of systems complete and found to be negative unless listed in HPI.   EP Information / Studies Reviewed:    EKG is ordered today. Personal review as below.  EKG Interpretation Date/Time:  Tuesday November 16 2023 09:06:47 EDT Ventricular Rate:  71 PR Interval:  158 QRS Duration:  100 QT Interval:  412 QTC Calculation: 447 R Axis:   77  Text Interpretation: Normal sinus rhythm Normal ECG When compared with ECG of 28-Oct-2023 15:17, No significant change was found  Confirmed by Micaila Ziemba ([id number]) on 11/16/2023 9:08:21 AM     Risk Assessment/Calculations:    CHA2DS2-VASc Score = 1   This indicates a 0.6% annual risk of stroke. The patient's score is based upon: CHF History: 0 HTN History: 0 Diabetes History: 0 Stroke History: 0 Vascular Disease History: 0 Age Score: 0 Gender Score: 1             Physical Exam:   VS:  There were no vitals taken for this visit.   Wt Readings from Last 3 Encounters:  10/20/23 182 lb 6.4 oz (82.7 kg)  10/18/23 186 lb (84.4 kg)  10/04/23 186 lb (84.4 kg)     GEN: Well nourished, well developed in no acute distress NECK: No JVD; No carotid bruits CARDIAC: Regular rate and rhythm, no murmurs, rubs, gallops RESPIRATORY:  Clear to auscultation without rales, wheezing or rhonchi  ABDOMEN: Soft, non-tender, non-distended EXTREMITIES:  No edema; No deformity   ASSESSMENT AND PLAN:    1.  Paroxysmal atrial fibrillation: On Eliquis , metoprolol  and flecainide .  She would prefer to avoid long-term antiarrhythmic medications.  She feels quite poorly in atrial fibrillation and would prefer rhythm control.  Due to that, we Makenly Larabee plan for ablation.  Risk, benefits, and alternatives to EP study and radiofrequency/pulse field ablation for afib were also discussed in detail today. These risks include but are not limited to stroke, bleeding, vascular damage, tamponade, perforation, damage to the esophagus, lungs, and other structures, pulmonary vein stenosis, worsening renal function, and death. The patient understands these risk and wishes to  proceed.  We Harlem Thresher therefore proceed with catheter ablation at the next available time.  Carto, ICE, anesthesia are requested for the procedure.  Cornie Herrington also obtain CT PV protocol prior to the procedure to exclude LAA thrombus and further evaluate atrial anatomy.  2.  High-risk medication monitoring: Currently on flecainide .  QRS remains narrow.  Follow up with Afib Clinic as usual post  procedure  Signed, Natalin Bible Cortland Ding, MD

## 2023-11-16 ENCOUNTER — Encounter: Payer: Self-pay | Admitting: Cardiology

## 2023-11-16 ENCOUNTER — Ambulatory Visit: Attending: Cardiology | Admitting: Cardiology

## 2023-11-16 VITALS — BP 110/70 | HR 71 | Ht 67.0 in | Wt 180.0 lb

## 2023-11-16 DIAGNOSIS — Z01812 Encounter for preprocedural laboratory examination: Secondary | ICD-10-CM

## 2023-11-16 DIAGNOSIS — I480 Paroxysmal atrial fibrillation: Secondary | ICD-10-CM

## 2023-11-16 DIAGNOSIS — Z79899 Other long term (current) drug therapy: Secondary | ICD-10-CM | POA: Diagnosis not present

## 2023-11-16 DIAGNOSIS — Z5181 Encounter for therapeutic drug level monitoring: Secondary | ICD-10-CM

## 2023-11-16 NOTE — Patient Instructions (Signed)
 Medication Instructions:  Your physician recommends that you continue on your current medications as directed. Please refer to the Current Medication list given to you today.  *If you need a refill on your cardiac medications before your next appointment, please call your pharmacy*   Lab Work: Pre procedure labs -- we will call you to schedule:  BMP & CBC  If you have a lab test that is abnormal and we need to change your treatment, we will call you to review the results -- otherwise no news is good news.    Testing/Procedures: Your physician has requested that you have cardiac CT 1 month PRIOR to your ablation. Cardiac computed tomography (CT) is a painless test that uses an x-ray machine to take clear, detailed pictures of your heart. We will contact you if the result is abnormal. We will call you to schedule.  Your physician has recommended that you have an ablation. Catheter ablation is a medical procedure used to treat some cardiac arrhythmias (irregular heartbeats). During catheter ablation, a long, thin, flexible tube is put into a blood vessel in your groin (upper thigh), or neck. This tube is called an ablation catheter. It is then guided to your heart through the blood vessel. Radio frequency waves destroy small areas of heart tissue where abnormal heartbeats may cause an arrhythmia to start.   Your ablation is scheduled for 02/18/2024. Please arrive at [facility] at 5:30  am.  We will call/send instructions at a later date.   Follow-Up: At [facility], you and your health needs are our priority.  As part of our continuing mission to provide you with exceptional heart care, we have created designated Provider Care Teams.  These Care Teams include your primary Cardiologist (physician) and Advanced Practice Providers (APPs -  Physician Assistants and Nurse Practitioners) who all work together to provide you with the care you need, when you need it.  Your next appointment:    1 month(s) after your ablation  The format for your next appointment:   In Person  Provider:   AFib clinic   Thank you for choosing [facility]!!   Reece Cane, RN [phone]    Other Instructions   Cardiac Ablation Cardiac ablation is a procedure to destroy (ablate) some heart tissue that is sending bad signals. These bad signals cause problems in heart rhythm. The heart has many areas that make these signals. If there are problems in these areas, they can make the heart beat in a way that is not normal. Destroying some tissues can help make the heart rhythm normal. Tell your doctor about: Any allergies you have. All medicines you are taking. These include vitamins, herbs, eye drops, creams, and over-the-counter medicines. Any problems you or family members have had with medicines that make you fall asleep (anesthetics). Any blood disorders you have. Any surgeries you have had. Any medical conditions you have, such as kidney failure. Whether you are pregnant or may be pregnant. What are the risks? This is a safe procedure. But problems may occur, including: Infection. Bruising and bleeding. Bleeding into the chest. Stroke or blood clots. Damage to nearby areas of your body. Allergies to medicines or dyes. The need for a pacemaker if the normal system is damaged. Failure of the procedure to treat the problem. What happens before the procedure? Medicines Ask your doctor about: Changing or stopping your normal medicines. This is important. Taking aspirin  and ibuprofen. Do not take these medicines unless your doctor tells  you to take them. Taking other medicines, vitamins, herbs, and supplements. General instructions Follow instructions from your doctor about what you cannot eat or drink. Plan to have someone take you home from the hospital or clinic. If you will be going home right after the procedure, plan to have someone with you for 24 hours. Ask your  doctor what steps will be taken to prevent infection. What happens during the procedure?  An IV tube will be put into one of your veins. You will be given a medicine to help you relax. The skin on your neck or groin will be numbed. A cut (incision) will be made in your neck or groin. A needle will be put through your cut and into a large vein. A tube (catheter) will be put into the needle. The tube will be moved to your heart. Dye may be put through the tube. This helps your doctor see your heart. Small devices (electrodes) on the tube will send out signals. A type of energy will be used to destroy some heart tissue. The tube will be taken out. Pressure will be held on your cut. This helps stop bleeding. A bandage will be put over your cut. The exact procedure may vary among doctors and hospitals. What happens after the procedure? You will be watched until you leave the hospital or clinic. This includes checking your heart rate, breathing rate, oxygen, and blood pressure. Your cut will be watched for bleeding. You will need to lie still for a few hours. Do not drive for 24 hours or as long as your doctor tells you. Summary Cardiac ablation is a procedure to destroy some heart tissue. This is done to treat heart rhythm problems. Tell your doctor about any medical conditions you may have. Tell him or her about all medicines you are taking to treat them. This is a safe procedure. But problems may occur. These include infection, bruising, bleeding, and damage to nearby areas of your body. Follow what your doctor tells you about food and drink. You may also be told to change or stop some of your medicines. After the procedure, do not drive for 24 hours or as long as your doctor tells you. This information is not intended to replace advice given to you by your health care provider. Make sure you discuss any questions you have with your health care provider. Document Revised: 09/26/2021 Document  Reviewed: 06/08/2019 Elsevier Patient Education  2023 [employer] Inc[employer]   Cardiac Ablation, Care After  This sheet gives you information about how to care for yourself after your procedure. Your health care provider may also give you more specific instructions. If you have problems or questions, contact your health care provider. What can I expect after the procedure? After the procedure, it is common to have: Bruising around your puncture site. Tenderness around your puncture site. Skipped heartbeats. If you had an atrial fibrillation ablation, you may have atrial fibrillation during the first several months after your procedure.  Tiredness (fatigue).  Follow these instructions at home: Puncture site care  Follow instructions from your health care provider about how to take care of your puncture site. Make sure you: If present, leave stitches (sutures), skin glue, or adhesive strips in place. These skin closures may need to stay in place for up to 2 weeks. If adhesive strip edges start to loosen and curl up, you may trim the loose edges. Do not remove adhesive strips completely unless your health care provider tells you to  do that. If a large square bandage is present, this may be removed 24 hours after surgery.  Check your puncture site every day for signs of infection. Check for: Redness, swelling, or pain. Fluid or blood. If your puncture site starts to bleed, lie down on your back, apply firm pressure to the area, and contact your health care provider. Warmth. Pus or a bad smell. A pea or small marble sized lump at the site is normal and can take up to three months to resolve.  Driving Do not drive for at least 4 days after your procedure or however long your health care provider recommends. (Do not resume driving if you have previously been instructed not to drive for other health reasons.) Do not drive or use heavy machinery while taking prescription pain medicine. Activity Avoid  activities that take a lot of effort for at least 7 days after your procedure. Do not lift anything that is heavier than 5 lb (4.5 kg) for one week.  No sexual activity for 1 week.  Return to your normal activities as told by your health care provider. Ask your health care provider what activities are safe for you. General instructions Take over-the-counter and prescription medicines only as told by your health care provider. Do not use any products that contain nicotine or tobacco, such as cigarettes and e-cigarettes. If you need help quitting, ask your health care provider. You may shower after 24 hours, but Do not take baths, swim, or use a hot tub for 1 week.  Do not drink alcohol for 24 hours after your procedure. Keep all follow-up visits as told by your health care provider. This is important. Contact a health care provider if: You have redness, mild swelling, or pain around your puncture site. You have fluid or blood coming from your puncture site that stops after applying firm pressure to the area. Your puncture site feels warm to the touch. You have pus or a bad smell coming from your puncture site. You have a fever. You have chest pain or discomfort that spreads to your neck, jaw, or arm. You have chest pain that is worse with lying on your back or taking a deep breath. You are sweating a lot. You feel nauseous. You have a fast or irregular heartbeat. You have shortness of breath. You are dizzy or light-headed and feel the need to lie down. You have pain or numbness in the arm or leg closest to your puncture site. Get help right away if: Your puncture site suddenly swells. Your puncture site is bleeding and the bleeding does not stop after applying firm pressure to the area. These symptoms may represent a serious problem that is an emergency. Do not wait to see if the symptoms will go away. Get medical help right away. Call your local emergency services (911 in the [place] Do not  drive yourself to the hospital. Summary After the procedure, it is normal to have bruising and tenderness at the puncture site in your groin, neck, or forearm. Check your puncture site every day for signs of infection. Get help right away if your puncture site is bleeding and the bleeding does not stop after applying firm pressure to the area. This is a medical emergency. This information is not intended to replace advice given to you by your health care provider. Make sure you discuss any questions you have with your health care provider.

## 2024-01-15 ENCOUNTER — Other Ambulatory Visit (HOSPITAL_COMMUNITY): Payer: Self-pay | Admitting: Internal Medicine

## 2024-01-17 ENCOUNTER — Telehealth: Payer: Self-pay

## 2024-01-17 NOTE — Telephone Encounter (Signed)
  Called patient, NA,  left message to contact our office to schedule labs for upcoming CT and Ablation .   [place] Ablation : Fri, 02/18/2024 @ 7:30 CT : Mon, 01/31/24 @ 4pm Labs : Mon, 02/04/24   Zepbound : 1xWK

## 2024-01-18 ENCOUNTER — Other Ambulatory Visit: Payer: Self-pay

## 2024-01-18 DIAGNOSIS — I480 Paroxysmal atrial fibrillation: Secondary | ICD-10-CM

## 2024-01-18 DIAGNOSIS — Z01812 Encounter for preprocedural laboratory examination: Secondary | ICD-10-CM

## 2024-01-28 ENCOUNTER — Other Ambulatory Visit (HOSPITAL_COMMUNITY)

## 2024-01-31 ENCOUNTER — Ambulatory Visit (HOSPITAL_COMMUNITY)
Admission: RE | Admit: 2024-01-31 | Discharge: 2024-01-31 | Disposition: A | Discharge status: Home / Self Care | Source: Ambulatory Visit | Attending: Cardiology | Admitting: Cardiology

## 2024-01-31 DIAGNOSIS — I480 Paroxysmal atrial fibrillation: Secondary | ICD-10-CM | POA: Diagnosis present

## 2024-01-31 MED ORDER — IOHEXOL 350 MG/ML SOLN
100.0000 mL | Freq: Once | INTRAVENOUS | Status: AC | PRN
  Administered 2024-01-31: 100 mL via INTRAVENOUS

## 2024-02-01 LAB — CBC
Hematocrit: 38.6 % (ref 34.0–46.6)
Hemoglobin: 12.6 g/dL (ref 11.1–15.9)
MCH: 31 pg (ref 26.6–33.0)
MCHC: 32.6 g/dL (ref 31.5–35.7)
MCV: 95 fL (ref 79–97)
Platelets: 303 x10E3/uL (ref 150–450)
RBC: 4.07 x10E6/uL (ref 3.77–5.28)
RDW: 12.9 % (ref 11.7–15.4)
WBC: 9.6 x10E3/uL (ref 3.4–10.8)

## 2024-02-01 LAB — BASIC METABOLIC PANEL WITH GFR
BUN/Creatinine Ratio: 16 (ref 9–23)
BUN: 10 mg/dL (ref 6–24)
CO2: 22 mmol/L (ref 20–29)
Calcium: 8.6 mg/dL — ABNORMAL LOW (ref 8.7–10.2)
Chloride: 105 mmol/L (ref 96–106)
Creatinine, Ser: 0.64 mg/dL (ref 0.57–1.00)
Glucose: 88 mg/dL (ref 70–99)
Potassium: 4 mmol/L (ref 3.5–5.2)
Sodium: 141 mmol/L (ref 134–144)
eGFR: 110 mL/min/1.73 (ref >59)

## 2024-02-11 ENCOUNTER — Telehealth (HOSPITAL_COMMUNITY): Payer: Self-pay

## 2024-02-11 NOTE — Telephone Encounter (Signed)
 Spoke with patient to discuss upcoming procedure.   CT: completed.  Labs: completed.   Any recent signs of acute illness or been started on antibiotics? No Any new medications started? No Any medications to hold?  Hold Zepbound for 7 days prior to procedure- last dose on July 24.  Any missed doses of blood thinner? Pt admits she has missed several evening doses of Eliquis  within the past 4 weeks, especially if she is not home. Will update Dr. Inocencio Discussed the importance of patient taking ANTICOAGULANT: Eliquis  (Apixaban ) twice daily without missing any doses and provided tips to help with medication compliance.  Medication instructions:  On the morning of your procedure DO NOT take any medication., including Eliquis  or the procedure may be rescheduled. Nothing to eat or drink after midnight prior to your procedure.  Confirmed patient is scheduled for Atrial Fibrillation Ablation on Friday, August 1 with Dr. Soyla Inocencio. Instructed patient to arrive at the Main Entrance A at [facility]: [address] [place], [place] [address] and check in at Admitting at 5:30 AM.  Advised of plan to go home the same day and will only stay overnight if medically necessary. You MUST have a responsible adult to drive you home and MUST be with you the first 24 hours after you arrive home or your procedure could be cancelled.  Patient verbalized understanding to all instructions provided and agreed to proceed with procedure.

## 2024-02-14 NOTE — Telephone Encounter (Signed)
 Attempted to reach patient, no answer. Left detailed message/DPR on file, regarding the importance of medication compliance with taking Eliquis  twice daily prior to her procedure but do not take any medications including Eliquis  on the morning of procedure.

## 2024-02-17 ENCOUNTER — Encounter (HOSPITAL_COMMUNITY): Payer: Self-pay | Admitting: Cardiology

## 2024-02-17 NOTE — Anesthesia Preprocedure Evaluation (Signed)
 Anesthesia Evaluation  Patient identified by MRN, date of birth, ID band Patient awake    Reviewed: Allergy & Precautions, NPO status , Patient's Chart, lab work & pertinent test results, reviewed documented beta blocker date and time   Airway Mallampati: II  TM Distance: >3 FB     Dental no notable dental hx. (+) Teeth Intact, Dental Advisory Given   Pulmonary neg pulmonary ROS   Pulmonary exam normal breath sounds clear to auscultation       Cardiovascular Normal cardiovascular exam+ dysrhythmias Atrial Fibrillation  Rhythm:Regular Rate:Normal  On Tambacor  Echo 10/29/23 1. Left ventricular ejection fraction, by estimation, is 60 to 65%. The  left ventricle has normal function. The left ventricle has no regional  wall motion abnormalities. Left ventricular diastolic parameters were  normal.   2. Right ventricular systolic function is normal. The right ventricular  size is normal. There is normal pulmonary artery systolic pressure. The  estimated right ventricular systolic pressure is 9.2 mmHg.   3. The mitral valve is normal in structure. Trivial mitral valve  regurgitation. No evidence of mitral stenosis.   4. The aortic valve is normal in structure. Aortic valve regurgitation is  not visualized. No aortic stenosis is present.   5. The inferior vena cava is normal in size with greater than 50%  respiratory variability, suggesting right atrial pressure of 3 mmHg.   EKG 11/16/23 NSR, Normal   Neuro/Psych Hx/o Bell's palsy  Neuromuscular disease  negative psych ROS   GI/Hepatic Neg liver ROS,,,Chronic constipation- on Linzess   Endo/Other  Obesity GLP-1 RA therapy- last dose 7/25  Renal/GU negative Renal ROS  negative genitourinary   Musculoskeletal negative musculoskeletal ROS (+)    Abdominal   Peds  Hematology Eliquis  therapy- last dose 7/31 pm   Anesthesia Other Findings   Reproductive/Obstetrics                               Anesthesia Physical Anesthesia Plan  ASA: 2  Anesthesia Plan: General   Post-op Pain Management: Minimal or no pain anticipated   Induction: Intravenous  PONV Risk Score and Plan: 3 and Treatment may vary due to age or medical condition, Ondansetron  and Dexamethasone   Airway Management Planned: Oral ETT  Additional Equipment: None  Intra-op Plan:   Post-operative Plan: Extubation in OR  Informed Consent: I have reviewed the patients History and Physical, chart, labs and discussed the procedure including the risks, benefits and alternatives for the proposed anesthesia with the patient or authorized representative who has indicated his/her understanding and acceptance.     Dental advisory given  Plan Discussed with: CRNA and Anesthesiologist  Anesthesia Plan Comments:          Anesthesia Quick Evaluation

## 2024-02-17 NOTE — Pre-Procedure Instructions (Signed)
 Instructed patient on the following items: Arrival time 0530 Nothing to eat or drink after midnight No meds AM of procedure Responsible person to drive you home and stay with you for 24 hrs  Have you missed any doses of anti-coagulant Eliquis - has missed a few evening doses.  Will do EKG on arrival.  Don't take dose morning of procedure  Last Zepbound 02/10/24.

## 2024-02-18 ENCOUNTER — Encounter (HOSPITAL_COMMUNITY)
Admission: RE | Disposition: A | Discharge status: Home / Self Care | Payer: Self-pay | Source: Home / Self Care | Attending: Cardiology

## 2024-02-18 ENCOUNTER — Encounter (HOSPITAL_COMMUNITY): Payer: Self-pay | Admitting: Cardiology

## 2024-02-18 ENCOUNTER — Ambulatory Visit (HOSPITAL_COMMUNITY): Admitting: Anesthesiology

## 2024-02-18 ENCOUNTER — Ambulatory Visit (HOSPITAL_BASED_OUTPATIENT_CLINIC_OR_DEPARTMENT_OTHER): Admitting: Anesthesiology

## 2024-02-18 ENCOUNTER — Inpatient Hospital Stay (HOSPITAL_COMMUNITY)
Admit: 2024-02-18 | Discharge: 2024-02-18 | Disposition: A | Discharge status: Home / Self Care | Attending: Cardiology | Admitting: Cardiology

## 2024-02-18 ENCOUNTER — Other Ambulatory Visit: Payer: Self-pay

## 2024-02-18 ENCOUNTER — Ambulatory Visit (HOSPITAL_COMMUNITY)
Admission: RE | Admit: 2024-02-18 | Discharge: 2024-02-18 | Disposition: A | Discharge status: Home / Self Care | Attending: Cardiology | Admitting: Cardiology

## 2024-02-18 DIAGNOSIS — Z7901 Long term (current) use of anticoagulants: Secondary | ICD-10-CM | POA: Insufficient documentation

## 2024-02-18 DIAGNOSIS — I480 Paroxysmal atrial fibrillation: Secondary | ICD-10-CM

## 2024-02-18 DIAGNOSIS — I4891 Unspecified atrial fibrillation: Secondary | ICD-10-CM | POA: Diagnosis not present

## 2024-02-18 HISTORY — PX: ATRIAL FIBRILLATION ABLATION: EP1191

## 2024-02-18 LAB — POCT ACTIVATED CLOTTING TIME: Activated Clotting Time: 337 s

## 2024-02-18 SURGERY — ATRIAL FIBRILLATION ABLATION
Anesthesia: General

## 2024-02-18 MED ORDER — ONDANSETRON HCL 4 MG/2ML IJ SOLN: 4.0000 mg | Freq: Four times a day (QID) | INTRAMUSCULAR | Status: DC | PRN

## 2024-02-18 MED ORDER — ACETAMINOPHEN 325 MG PO TABS
650.0000 mg | ORAL_TABLET | ORAL | Status: DC | PRN
Start: 2024-02-18 — End: 2024-02-18

## 2024-02-18 MED ORDER — SODIUM CHLORIDE 0.9% FLUSH: 3.0000 mL | INTRAVENOUS | Status: DC | PRN

## 2024-02-18 MED ORDER — PHENYLEPHRINE 80 MCG/ML (10ML) SYRINGE FOR IV PUSH (FOR BLOOD PRESSURE SUPPORT)
PREFILLED_SYRINGE | INTRAVENOUS | Status: DC | PRN
  Administered 2024-02-18: 160 ug via INTRAVENOUS

## 2024-02-18 MED ORDER — MIDAZOLAM HCL 2 MG/2ML IJ SOLN
INTRAMUSCULAR | Status: AC
  Filled 2024-02-18: qty 2

## 2024-02-18 MED ORDER — PROTAMINE SULFATE 10 MG/ML IV SOLN
INTRAVENOUS | Status: DC | PRN
  Administered 2024-02-18: 30 mg via INTRAVENOUS
  Administered 2024-02-18: 10 mg via INTRAVENOUS

## 2024-02-18 MED ORDER — ATROPINE SULFATE 1 MG/10ML IJ SOSY
PREFILLED_SYRINGE | INTRAMUSCULAR | Status: DC | PRN
  Administered 2024-02-18: 1 mg via INTRAVENOUS

## 2024-02-18 MED ORDER — SODIUM CHLORIDE 0.9 % IV SOLN: INTRAVENOUS | Status: DC

## 2024-02-18 MED ORDER — HEPARIN (PORCINE) IN NACL 1000-0.9 UT/500ML-% IV SOLN
INTRAVENOUS | Status: DC | PRN
  Administered 2024-02-18 (×3): 500 mL

## 2024-02-18 MED ORDER — SUGAMMADEX SODIUM 200 MG/2ML IV SOLN
INTRAVENOUS | Status: DC | PRN
  Administered 2024-02-18: 200 mg via INTRAVENOUS

## 2024-02-18 MED ORDER — FENTANYL CITRATE (PF) 250 MCG/5ML IJ SOLN: INTRAMUSCULAR | Status: DC | PRN

## 2024-02-18 MED ORDER — ROCURONIUM BROMIDE 10 MG/ML (PF) SYRINGE
PREFILLED_SYRINGE | INTRAVENOUS | Status: DC | PRN
Start: 2024-02-18 — End: 2024-02-18
  Administered 2024-02-18: 60 mg via INTRAVENOUS
  Administered 2024-02-18: 10 mg via INTRAVENOUS

## 2024-02-18 MED ORDER — HEPARIN SODIUM (PORCINE) 1000 UNIT/ML IJ SOLN
INTRAMUSCULAR | Status: DC | PRN
  Administered 2024-02-18: 14000 [IU] via INTRAVENOUS

## 2024-02-18 MED ORDER — PROPOFOL 500 MG/50ML IV EMUL
INTRAVENOUS | Status: DC | PRN
Start: 2024-02-18 — End: 2024-02-18
  Administered 2024-02-18: 150 ug/kg/min via INTRAVENOUS

## 2024-02-18 MED ORDER — LIDOCAINE 2% (20 MG/ML) 5 ML SYRINGE
INTRAMUSCULAR | Status: DC | PRN
Start: 2024-02-18 — End: 2024-02-18
  Administered 2024-02-18: 60 mg via INTRAVENOUS

## 2024-02-18 MED ORDER — ONDANSETRON HCL 4 MG/2ML IJ SOLN
INTRAMUSCULAR | Status: DC | PRN
  Administered 2024-02-18: 4 mg via INTRAVENOUS

## 2024-02-18 MED ORDER — MIDAZOLAM HCL 2 MG/2ML IJ SOLN
INTRAMUSCULAR | Status: DC | PRN
  Administered 2024-02-18: 2 mg via INTRAVENOUS

## 2024-02-18 MED ORDER — PROPOFOL 10 MG/ML IV BOLUS
INTRAVENOUS | Status: DC | PRN
  Administered 2024-02-18: 150 mg via INTRAVENOUS

## 2024-02-18 MED ORDER — HEPARIN SODIUM (PORCINE) 1000 UNIT/ML IJ SOLN
INTRAMUSCULAR | Status: AC
  Filled 2024-02-18: qty 20

## 2024-02-18 MED ORDER — ATROPINE SULFATE 1 MG/10ML IJ SOSY
PREFILLED_SYRINGE | INTRAMUSCULAR | Status: AC
  Filled 2024-02-18: qty 30

## 2024-02-18 MED ORDER — FENTANYL CITRATE (PF) 100 MCG/2ML IJ SOLN
INTRAMUSCULAR | Status: DC | PRN
  Administered 2024-02-18: 100 ug via INTRAVENOUS

## 2024-02-18 MED ORDER — SODIUM CHLORIDE 0.9 % IV SOLN: 250.0000 mL | INTRAVENOUS | Status: DC | PRN

## 2024-02-18 MED ORDER — FENTANYL CITRATE (PF) 100 MCG/2ML IJ SOLN
INTRAMUSCULAR | Status: AC
  Filled 2024-02-18: qty 2

## 2024-02-18 MED ORDER — DEXAMETHASONE SODIUM PHOSPHATE 10 MG/ML IJ SOLN
INTRAMUSCULAR | Status: DC | PRN
  Administered 2024-02-18: 10 mg via INTRAVENOUS

## 2024-02-18 MED ORDER — PHENYLEPHRINE HCL-NACL 20-0.9 MG/250ML-% IV SOLN
INTRAVENOUS | Status: DC | PRN
  Administered 2024-02-18: 30 ug/min via INTRAVENOUS

## 2024-02-18 SURGICAL SUPPLY — 19 items
BLANKET WARM UNDERBOD FULL ACC (MISCELLANEOUS) ×1 IMPLANT
CABLE FARASTAR GEN2 SNGL USE (CABLE) IMPLANT
CATH FARAWAVE 2.0 31 (CATHETERS) IMPLANT
CATH GE 8FR SOUNDSTAR (CATHETERS) IMPLANT
CATH OCTARAY 2.0 F 3-3-3-3-3 (CATHETERS) IMPLANT
CATH WEBSTER BI DIR CS D-F CRV (CATHETERS) IMPLANT
CLOSURE MYNX CONTROL 6F/7F (Vascular Products) IMPLANT
CLOSURE PERCLOSE PROSTYLE (VASCULAR PRODUCTS) IMPLANT
COVER SWIFTLINK CONNECTOR (BAG) ×1 IMPLANT
DILATOR VESSEL 38 20CM 16FR (INTRODUCER) IMPLANT
GUIDEWIRE INQWIRE 1.5J.035X260 (WIRE) IMPLANT
KIT VERSACROSS CNCT FARADRIVE (KITS) IMPLANT
PACK EP LF (CUSTOM PROCEDURE TRAY) ×1 IMPLANT
PAD DEFIB RADIO PHYSIO CONN (PAD) ×1 IMPLANT
PATCH CARTO3 (PAD) IMPLANT
SHEATH FARADRIVE STEERABLE (SHEATH) IMPLANT
SHEATH PINNACLE 8F 10CM (SHEATH) IMPLANT
SHEATH PINNACLE 9F 10CM (SHEATH) IMPLANT
SHEATH PROBE COVER 6X72 (BAG) IMPLANT

## 2024-02-18 NOTE — Anesthesia Procedure Notes (Signed)
 Procedure Name: Intubation Date/Time: 02/18/2024 7:45 AM  Performed by: Emmitt Millman, CRNAPre-anesthesia Checklist: Patient identified, Emergency Drugs available, Suction available and Patient being monitored Patient Re-evaluated:Patient Re-evaluated prior to induction Oxygen Delivery Method: Circle system utilized Preoxygenation: Pre-oxygenation with 100% oxygen Induction Type: IV induction Ventilation: Mask ventilation without difficulty Laryngoscope Size: Mac and 4 Grade View: Grade I Tube type: Oral Tube size: 7.0 mm Number of attempts: 1 Airway Equipment and Method: Stylet Placement Confirmation: ETT inserted through vocal cords under direct vision, positive ETCO2 and breath sounds checked- equal and bilateral Secured at: 22 cm Tube secured with: Tape Dental Injury: Teeth and Oropharynx as per pre-operative assessment

## 2024-02-18 NOTE — H&P (Signed)
  Electrophysiology Office Note:   Date:  02/18/2024  ID:  Anna Evans, DOB 20-Feb-1976, MRN [id number]  Primary Cardiologist: None Primary Heart Failure: None Electrophysiologist: None      History of Present Illness:   Anna Evans is a 48 y.o. female with h/o atrial fibrillation seen today for  for Electrophysiology evaluation of atrial fibrillation at the request of Norleen Heinrich.    She presented to the emergency room 09/25/2023 with shortness of breath and palpitations.  She was found to be in atrial fibrillation.  She underwent successful cardioversion in the emergency room.  She does have a family history of atrial fibrillation.  She was started on metoprolol  and Eliquis .  She had another episode of atrial fibrillation requiring ER visit 10/18/2023.  She underwent successful cardioversion.  She has since been started on flecainide .  Today, denies symptoms of palpitations, chest pain, dyspnea, orthopnea, PND, lower extremity edema, claudication, dizziness, presyncope, syncope, bleeding, or neurologic sequela. The patient is tolerating medications without difficulties. Plan ablation today.   EP Information / Studies Reviewed:    EKG is ordered today. Personal review as below.        Risk Assessment/Calculations:    CHA2DS2-VASc Score = 1   This indicates a 0.6% annual risk of stroke. The patient's score is based upon: CHF History: 0 HTN History: 0 Diabetes History: 0 Stroke History: 0 Vascular Disease History: 0 Age Score: 0 Gender Score: 1             Physical Exam:   VS:  BP 122/63   Pulse 64   Temp 97.9 F (36.6 C) (Oral)   Resp 18   Ht 5' 7 (1.702 m)   Wt 78.5 kg   SpO2 99%   BMI 27.10 kg/m    Wt Readings from Last 3 Encounters:  02/18/24 78.5 kg  11/16/23 81.6 kg  10/20/23 82.7 kg    GEN: No acute distress.   Neck: No JVD Cardiac: RRR, no murmurs, rubs, or gallops.  Respiratory: normal BS bilaterally. GI: Soft, nontender, non-distended   MS: No edema; No deformity. Neuro:  Nonfocal  Skin: warm and dry Psych: Normal affect    ASSESSMENT AND PLAN:    1.  Paroxysmal atrial fibrillation: Anna Evans has presented today for surgery, with the diagnosis of AF.  The various methods of treatment have been discussed with the patient and family. After consideration of risks, benefits and other options for treatment, the patient has consented to  Procedure(s): Catheter ablation as a surgical intervention .  Risks include but not limited to complete heart block, stroke, esophageal damage, nerve damage, bleeding, vascular damage, tamponade, perforation, MI, and death. The patient's history has been reviewed, patient examined, no change in status, stable for surgery.  I have reviewed the patient's chart and labs.  Questions were answered to the patient's satisfaction.    Holle Sprick Inocencio, MD 02/18/2024 7:11 AM

## 2024-02-18 NOTE — Addendum Note (Signed)
 Addendum  created 02/18/24 1134 by Emmitt Millman, CRNA   Intraprocedure Meds edited

## 2024-02-18 NOTE — Transfer of Care (Signed)
 Immediate Anesthesia Transfer of Care Note  Patient: Anna Evans  Procedure(s) Performed: ATRIAL FIBRILLATION ABLATION  Patient Location: Cath Lab  Anesthesia Type:General  Level of Consciousness: awake, alert , and oriented  Airway & Oxygen Therapy: Patient Spontanous Breathing  Post-op Assessment: Report given to RN and Post -op Vital signs reviewed and stable  Post vital signs: Reviewed and stable  Last Vitals:  Vitals Value Taken Time  BP 114/62 02/18/24 09:00  Temp 97.7   Pulse 95 02/18/24 09:02  Resp 19 02/18/24 09:02  SpO2 98 % 02/18/24 09:02  Vitals shown include unfiled device data.  Last Pain:  Vitals:   02/18/24 0550  TempSrc: Oral  PainSc: 0-No pain         Complications: There were no known notable events for this encounter.

## 2024-02-18 NOTE — Discharge Instructions (Signed)

## 2024-02-18 NOTE — Anesthesia Postprocedure Evaluation (Signed)
 Anesthesia Post Note  Patient: Anna Evans  Procedure(s) Performed: ATRIAL FIBRILLATION ABLATION     Patient location during evaluation: PACU Anesthesia Type: General Level of consciousness: awake and alert and oriented Pain management: pain level controlled Vital Signs Assessment: post-procedure vital signs reviewed and stable Respiratory status: spontaneous breathing, nonlabored ventilation and respiratory function stable Cardiovascular status: blood pressure returned to baseline and stable Postop Assessment: no apparent nausea or vomiting Anesthetic complications: no   There were no known notable events for this encounter.  Last Vitals:  Vitals:   02/18/24 0930 02/18/24 0935  BP: 115/69   Pulse: 93   Resp: 20   Temp:  36.6 C  SpO2: 99%     Last Pain:  Vitals:   02/18/24 0952  TempSrc:   PainSc: 2    Pain Goal:                   Jency Schnieders A.

## 2024-02-19 ENCOUNTER — Encounter (HOSPITAL_COMMUNITY): Payer: Self-pay | Admitting: Cardiology

## 2024-02-21 ENCOUNTER — Telehealth (HOSPITAL_COMMUNITY): Payer: Self-pay

## 2024-02-21 NOTE — Telephone Encounter (Signed)
 Spoke with patient to complete post procedure follow up call.  Patient reports no complications with groin sites.   Instructions reviewed with patient:  It is normal to have bruising, tenderness, mild swelling, and a pea or marble sized lump/knot at the groin site which can take up to three months to resolve.  Get help right away if you notice sudden swelling at the puncture site.  Check your puncture site every day for signs of infection: fever, redness, swelling, pus drainage, warmth, foul odor or excessive pain. If this occurs, please call the office at [phone], to speak with the nurse. Get help right away if your puncture site is bleeding and the bleeding does not stop after applying firm pressure to the area.  You may continue to have skipped beats/ atrial fibrillation during the first several months after your procedure.  It is very important not to miss any doses of your blood thinner Eliquis .    You will follow up with the Afib clinic on 03/17/24 and follow up with the Afib clinic on 05/22/24.    Patient verbalized understanding to all instructions provided.

## 2024-02-23 ENCOUNTER — Encounter: Payer: Self-pay | Admitting: Emergency Medicine

## 2024-03-16 ENCOUNTER — Other Ambulatory Visit (HOSPITAL_COMMUNITY): Payer: Self-pay

## 2024-03-17 ENCOUNTER — Ambulatory Visit (HOSPITAL_COMMUNITY): Admitting: Internal Medicine

## 2024-03-21 ENCOUNTER — Ambulatory Visit (HOSPITAL_COMMUNITY): Admitting: Internal Medicine

## 2024-03-28 ENCOUNTER — Encounter (HOSPITAL_COMMUNITY): Payer: Self-pay | Admitting: Internal Medicine

## 2024-03-28 ENCOUNTER — Ambulatory Visit (HOSPITAL_COMMUNITY)
Admission: RE | Admit: 2024-03-28 | Discharge: 2024-03-28 | Disposition: A | Discharge status: Home / Self Care | Source: Ambulatory Visit | Attending: Internal Medicine | Admitting: Internal Medicine

## 2024-03-28 VITALS — BP 110/72 | HR 75 | Ht 67.0 in | Wt 180.0 lb

## 2024-03-28 DIAGNOSIS — Z79899 Other long term (current) drug therapy: Secondary | ICD-10-CM | POA: Diagnosis not present

## 2024-03-28 DIAGNOSIS — Z5181 Encounter for therapeutic drug level monitoring: Secondary | ICD-10-CM | POA: Diagnosis not present

## 2024-03-28 DIAGNOSIS — I480 Paroxysmal atrial fibrillation: Secondary | ICD-10-CM

## 2024-03-28 NOTE — Progress Notes (Signed)
 Primary Care Physician: Auston Opal, DO Primary Cardiologist: None Electrophysiologist: None     Referring Physician: ED    Anna Evans is a 48 y.o. female with no significant past medical history who presents for consultation in the [facility]. ED visit on 09/25/23 for SOB and heart racing found to be in new onset Afib with RVR. She underwent successful DCCV in the ED on 3/8. She does note family history of Afib. Discharged on Lopressor  25 mg BID. Patient is on Eliquis  for a CHADS2VASC score of 1.  On follow up 03/28/24, patient is currently in NSR. S/p Afib ablation on 02/18/24 by Dr. Inocencio. No episodes of Afib since ablation. She is doing very well since ablation. She is taking flecainide  50 mg BID. No chest pain or SOB. Leg sites healed without issue. No missed doses of anticoagulant.  Today, she denies symptoms of orthopnea, PND, lower extremity edema, dizziness, presyncope, syncope, snoring, daytime somnolence, bleeding, or neurologic sequela. The patient is tolerating medications without difficulties and is otherwise without complaint today.     Atrial Fibrillation Risk Factors:  she does have symptoms or diagnosis of sleep apnea.  she has a BMI of Body mass index is 28.19 kg/m.SABRA Filed Weights   03/28/24 1037  Weight: 81.6 kg      Current Outpatient Medications  Medication Sig Dispense Refill   apixaban  (ELIQUIS ) 5 MG TABS tablet Take 5 mg by mouth 2 (two) times daily.     cetirizine (ZYRTEC) 10 MG tablet Take 10 mg by mouth daily as needed for allergies.     flecainide  (TAMBOCOR ) 50 MG tablet TAKE 1 TABLET BY MOUTH TWICE A DAY 180 tablet 1   fluticasone (FLONASE) 50 MCG/ACT nasal spray Place 1 spray into both nostrils daily as needed for allergies.     LINZESS 145 MCG CAPS capsule Take 145 mcg by mouth daily as needed (constipation).     melatonin 5 MG TABS Take 10 mg by mouth at bedtime as needed (sleep).     metoprolol  tartrate  (LOPRESSOR ) 25 MG tablet Take 0.5 tablets (12.5 mg total) by mouth 2 (two) times daily. 30 tablet 3   omeprazole (PRILOSEC) 40 MG capsule Take 40 mg by mouth daily.     tirzepatide (ZEPBOUND) 10 MG/0.5ML Pen Inject 10 mg into the skin See admin instructions. Taking every 10 days     No current facility-administered medications for this encounter.    Atrial Fibrillation Management history:  Previous antiarrhythmic drugs: flecainide  Previous cardioversions: 09/25/23 (ED), 10/18/23 (ED) Previous ablations: 02/18/24 Anticoagulation history: Eliquis    ROS- All systems are reviewed and negative except as per the HPI above.  Physical Exam: BP 110/72   Pulse 75   Ht 5' 7 (1.702 m)   Wt 81.6 kg   BMI 28.19 kg/m   GEN- The patient is well appearing, alert and oriented x 3 today.   Neck - no JVD or carotid bruit noted Lungs- Clear to ausculation bilaterally, normal work of breathing Heart- Regular rate and rhythm, no murmurs, rubs or gallops, PMI not laterally displaced Extremities- no clubbing, cyanosis, or edema Skin - no rash or ecchymosis noted   EKG today demonstrates  Vent. rate 75 BPM PR interval 152 ms QRS duration 94 ms QT/QTcB 404/451 ms P-R-T axes 69 69 66 Normal sinus rhythm Normal ECG When compared with ECG of 18-Feb-2024 09:13, No significant change was found  Echo 10/29/23: 1. Left ventricular ejection fraction, by estimation, is  60 to 65%. The  left ventricle has normal function. The left ventricle has no regional  wall motion abnormalities. Left ventricular diastolic parameters were  normal.   2. Right ventricular systolic function is normal. The right ventricular  size is normal. There is normal pulmonary artery systolic pressure. The  estimated right ventricular systolic pressure is 9.2 mmHg.   3. The mitral valve is normal in structure. Trivial mitral valve  regurgitation. No evidence of mitral stenosis.   4. The aortic valve is normal in structure. Aortic  valve regurgitation is  not visualized. No aortic stenosis is present.   5. The inferior vena cava is normal in size with greater than 50%  respiratory variability, suggesting right atrial pressure of 3 mmHg.   ASSESSMENT & PLAN CHA2DS2-VASc Score = 1  The patient's score is based upon: CHF History: 0 HTN History: 0 Diabetes History: 0 Stroke History: 0 Vascular Disease History: 0 Age Score: 0 Gender Score: 1       ASSESSMENT AND PLAN: Paroxysmal Atrial Fibrillation (ICD10:  I48.0) The patient's CHA2DS2-VASc score is 1, indicating a 0.6% annual risk of stroke.   S/p successful DCCV in ED on 09/25/23. S/p successful DCCV in ED on 10/18/23. S/p Afib ablation on 02/18/24 by Dr. Inocencio.  Patient is currently in NSR. She is doing well since ablation. Continue Lopressor  12.5 mg BID. Anticipate stopping flecainide  and Eliquis  at next visit.   High risk medication monitoring (ICD10: [id number]) Patient requires ongoing monitoring for anti-arrhythmic medication which has the potential to cause life threatening arrhythmias or AV block. ECG intervals are stable. Continue flecainide  50 mg BID.   Follow up with EP as scheduled.   Terra Pac, PA-C  Af[facility] [address] [place], [place] [address] [phone]

## 2024-04-11 ENCOUNTER — Other Ambulatory Visit (HOSPITAL_COMMUNITY): Payer: Self-pay | Admitting: Internal Medicine

## 2024-05-22 ENCOUNTER — Ambulatory Visit (HOSPITAL_COMMUNITY)
Admission: RE | Admit: 2024-05-22 | Discharge: 2024-05-22 | Disposition: A | Discharge status: Home / Self Care | Source: Ambulatory Visit | Attending: Internal Medicine | Admitting: Internal Medicine

## 2024-05-22 ENCOUNTER — Encounter (HOSPITAL_COMMUNITY): Payer: Self-pay | Admitting: Internal Medicine

## 2024-05-22 VITALS — BP 110/80 | HR 64 | Ht 67.0 in | Wt 180.4 lb

## 2024-05-22 DIAGNOSIS — Z5181 Encounter for therapeutic drug level monitoring: Secondary | ICD-10-CM | POA: Diagnosis not present

## 2024-05-22 DIAGNOSIS — Z79899 Other long term (current) drug therapy: Secondary | ICD-10-CM | POA: Diagnosis not present

## 2024-05-22 DIAGNOSIS — I480 Paroxysmal atrial fibrillation: Secondary | ICD-10-CM

## 2024-05-22 NOTE — Progress Notes (Signed)
 Primary Care Physician: Auston Opal, DO Primary Cardiologist: None Electrophysiologist: None     Referring Physician: ED    Anna Evans is a 48 y.o. female with no significant past medical history who presents for consultation in the [facility]. ED visit on 09/25/23 for SOB and heart racing found to be in new onset Afib with RVR. She underwent successful DCCV in the ED on 3/8. She does note family history of Afib. Discharged on Lopressor  25 mg BID. Patient is on Eliquis  for a CHADS2VASC score of 1.  On follow up 03/28/24, patient is currently in NSR. S/p Afib ablation on 02/18/24 by Dr. Inocencio. No episodes of Afib since ablation. She is doing very well since ablation. She is taking flecainide  50 mg BID. No chest pain or SOB. Leg sites healed without issue. No missed doses of anticoagulant.  On follow up 05/22/24, patient is currently in NSR. She is doing well overall since last office visit. She has no Afib burden and has been taking her meds only once in the morning.   Today, she denies symptoms of orthopnea, PND, lower extremity edema, dizziness, presyncope, syncope, snoring, daytime somnolence, bleeding, or neurologic sequela. The patient is tolerating medications without difficulties and is otherwise without complaint today.    Atrial Fibrillation Risk Factors:  she does have symptoms or diagnosis of sleep apnea.  she has a BMI of Body mass index is 28.25 kg/m.SABRA Filed Weights   05/22/24 1129  Weight: 81.8 kg     Current Outpatient Medications  Medication Sig Dispense Refill   cetirizine (ZYRTEC) 10 MG tablet Take 10 mg by mouth daily as needed for allergies.     fluticasone (FLONASE) 50 MCG/ACT nasal spray Place 1 spray into both nostrils daily as needed for allergies.     LINZESS 145 MCG CAPS capsule Take 145 mcg by mouth daily as needed (constipation).     melatonin 5 MG TABS Take 10 mg by mouth at bedtime as needed (sleep).      omeprazole (PRILOSEC) 40 MG capsule Take 40 mg by mouth daily.     tirzepatide (ZEPBOUND) 10 MG/0.5ML Pen Inject 10 mg into the skin See admin instructions. Taking every 10 days     No current facility-administered medications for this encounter.    Atrial Fibrillation Management history:  Previous antiarrhythmic drugs: flecainide  Previous cardioversions: 09/25/23 (ED), 10/18/23 (ED) Previous ablations: 02/18/24 Anticoagulation history: Eliquis    ROS- All systems are reviewed and negative except as per the HPI above.  Physical Exam: BP 110/80   Pulse 64   Ht 5' 7 (1.702 m)   Wt 81.8 kg   BMI 28.25 kg/m   GEN- The patient is well appearing, alert and oriented x 3 today.   Neck - no JVD or carotid bruit noted Lungs- Clear to ausculation bilaterally, normal work of breathing Heart- Regular rate and rhythm, no murmurs, rubs or gallops, PMI not laterally displaced Extremities- no clubbing, cyanosis, or edema Skin - no rash or ecchymosis noted   EKG today demonstrates  Vent. rate 64 BPM PR interval 150 ms QRS duration 98 ms QT/QTcB 420/433 ms P-R-T axes 82 70 63 Normal sinus rhythm Normal ECG When compared with ECG of 28-Mar-2024 10:40, No significant change was found  Echo 10/29/23: 1. Left ventricular ejection fraction, by estimation, is 60 to 65%. The  left ventricle has normal function. The left ventricle has no regional  wall motion abnormalities. Left ventricular diastolic parameters were  normal.   2. Right ventricular systolic function is normal. The right ventricular  size is normal. There is normal pulmonary artery systolic pressure. The  estimated right ventricular systolic pressure is 9.2 mmHg.   3. The mitral valve is normal in structure. Trivial mitral valve  regurgitation. No evidence of mitral stenosis.   4. The aortic valve is normal in structure. Aortic valve regurgitation is  not visualized. No aortic stenosis is present.   5. The inferior vena cava is  normal in size with greater than 50%  respiratory variability, suggesting right atrial pressure of 3 mmHg.   ASSESSMENT & PLAN CHA2DS2-VASc Score = 1  The patient's score is based upon: CHF History: 0 HTN History: 0 Diabetes History: 0 Stroke History: 0 Vascular Disease History: 0 Age Score: 0 Gender Score: 1       ASSESSMENT AND PLAN: Paroxysmal Atrial Fibrillation (ICD10:  I48.0) The patient's CHA2DS2-VASc score is 1, indicating a 0.6% annual risk of stroke.   S/p successful DCCV in ED on 09/25/23. S/p successful DCCV in ED on 10/18/23. S/p Afib ablation on 02/18/24 by Dr. Inocencio.  Patient is currently in NSR. She is doing well overall. Will stop flecainide  and Eliquis  today. Stop Lopressor  12.5 mg BID.   Follow up 6 months with Dr. Inocencio.    Terra Pac, PA-C  Afib Clinic [facility] [address] [place], [place] [address] [phone]
